# Patient Record
Sex: Female | Born: 1965 | Race: White | Hispanic: No | Marital: Married | Smoking: Former smoker
Health system: Southern US, Community
[De-identification: ages and names within clinical notes are randomized; demographics above are authoritative.]

## PROBLEM LIST (undated history)

## (undated) DIAGNOSIS — F419 Anxiety disorder, unspecified: Secondary | ICD-10-CM

## (undated) DIAGNOSIS — Z9289 Personal history of other medical treatment: Secondary | ICD-10-CM

## (undated) DIAGNOSIS — K219 Gastro-esophageal reflux disease without esophagitis: Secondary | ICD-10-CM

## (undated) DIAGNOSIS — Z1371 Encounter for nonprocreative screening for genetic disease carrier status: Secondary | ICD-10-CM

## (undated) DIAGNOSIS — Z803 Family history of malignant neoplasm of breast: Secondary | ICD-10-CM

## (undated) DIAGNOSIS — K2270 Barrett's esophagus without dysplasia: Secondary | ICD-10-CM

## (undated) DIAGNOSIS — Z8742 Personal history of other diseases of the female genital tract: Secondary | ICD-10-CM

## (undated) HISTORY — DX: Family history of malignant neoplasm of breast: Z80.3

## (undated) HISTORY — DX: Personal history of other diseases of the female genital tract: Z87.42

## (undated) HISTORY — DX: Encounter for nonprocreative screening for genetic disease carrier status: Z13.71

## (undated) HISTORY — DX: Gastro-esophageal reflux disease without esophagitis: K21.9

## (undated) HISTORY — DX: Personal history of other medical treatment: Z92.89

## (undated) HISTORY — DX: Barrett's esophagus without dysplasia: K22.70

## (undated) HISTORY — PX: CRYOTHERAPY: SHX1416

## (undated) HISTORY — DX: Anxiety disorder, unspecified: F41.9

---

## 2004-12-30 ENCOUNTER — Observation Stay: Payer: Self-pay

## 2005-01-24 ENCOUNTER — Observation Stay: Payer: Self-pay | Admitting: Obstetrics and Gynecology

## 2005-02-03 ENCOUNTER — Inpatient Hospital Stay: Payer: Self-pay | Admitting: Obstetrics and Gynecology

## 2006-12-20 ENCOUNTER — Ambulatory Visit: Payer: Self-pay | Admitting: Obstetrics and Gynecology

## 2008-01-02 ENCOUNTER — Ambulatory Visit: Payer: Self-pay | Admitting: Obstetrics and Gynecology

## 2009-03-04 ENCOUNTER — Ambulatory Visit: Payer: Self-pay | Admitting: Obstetrics and Gynecology

## 2010-03-31 ENCOUNTER — Ambulatory Visit: Payer: Self-pay | Admitting: Obstetrics and Gynecology

## 2010-04-06 ENCOUNTER — Ambulatory Visit: Payer: Self-pay | Admitting: Gastroenterology

## 2012-01-12 ENCOUNTER — Ambulatory Visit: Payer: Self-pay | Admitting: Obstetrics and Gynecology

## 2012-09-11 ENCOUNTER — Ambulatory Visit: Payer: Self-pay | Admitting: Gastroenterology

## 2012-09-13 ENCOUNTER — Ambulatory Visit: Payer: Self-pay | Admitting: Gastroenterology

## 2012-09-21 DIAGNOSIS — K2270 Barrett's esophagus without dysplasia: Secondary | ICD-10-CM

## 2012-09-21 HISTORY — DX: Barrett's esophagus without dysplasia: K22.70

## 2012-10-01 ENCOUNTER — Ambulatory Visit: Payer: Self-pay | Admitting: Gastroenterology

## 2012-10-22 ENCOUNTER — Ambulatory Visit: Payer: Self-pay | Admitting: Gastroenterology

## 2014-09-29 ENCOUNTER — Ambulatory Visit: Payer: Self-pay | Admitting: Obstetrics & Gynecology

## 2014-09-29 DIAGNOSIS — Z9289 Personal history of other medical treatment: Secondary | ICD-10-CM

## 2014-09-29 HISTORY — DX: Personal history of other medical treatment: Z92.89

## 2015-05-14 DIAGNOSIS — Z9289 Personal history of other medical treatment: Secondary | ICD-10-CM

## 2015-05-14 HISTORY — DX: Personal history of other medical treatment: Z92.89

## 2015-05-22 DIAGNOSIS — Z1371 Encounter for nonprocreative screening for genetic disease carrier status: Secondary | ICD-10-CM

## 2015-05-22 HISTORY — DX: Encounter for nonprocreative screening for genetic disease carrier status: Z13.71

## 2017-02-27 ENCOUNTER — Telehealth: Payer: Self-pay

## 2017-02-27 NOTE — Telephone Encounter (Signed)
Pt calling - blood in urine.  Thought it was her period but it's not.  [phone]

## 2017-03-01 NOTE — Telephone Encounter (Signed)
Pt would like a call back as no one called her Mon.

## 2017-03-02 NOTE — Telephone Encounter (Signed)
Called JP and adv. Had routed msg to her.  Pt info given and JP will sched.

## 2017-03-03 NOTE — Telephone Encounter (Signed)
Pt coming Monday

## 2017-03-06 ENCOUNTER — Ambulatory Visit: Payer: Self-pay | Admitting: Obstetrics and Gynecology

## 2017-04-10 ENCOUNTER — Encounter: Payer: Self-pay | Admitting: Obstetrics and Gynecology

## 2017-04-10 ENCOUNTER — Ambulatory Visit (INDEPENDENT_AMBULATORY_CARE_PROVIDER_SITE_OTHER): Payer: 59 | Admitting: Obstetrics and Gynecology

## 2017-04-10 VITALS — BP 118/70 | Ht 64.0 in | Wt 186.0 lb

## 2017-04-10 DIAGNOSIS — M545 Low back pain, unspecified: Secondary | ICD-10-CM

## 2017-04-10 DIAGNOSIS — R319 Hematuria, unspecified: Secondary | ICD-10-CM

## 2017-04-10 DIAGNOSIS — F419 Anxiety disorder, unspecified: Secondary | ICD-10-CM | POA: Insufficient documentation

## 2017-04-10 LAB — POCT URINALYSIS DIPSTICK
BILIRUBIN UA: NEGATIVE
Blood, UA: NEGATIVE
GLUCOSE UA: NEGATIVE
KETONES UA: NEGATIVE
LEUKOCYTES UA: NEGATIVE
NITRITE UA: NEGATIVE
Protein, UA: NEGATIVE
Spec Grav, UA: 1.015 (ref 1.010–1.025)
pH, UA: 5 (ref 5.0–8.0)

## 2017-04-10 NOTE — Progress Notes (Signed)
Chief Complaint  Patient presents with  . Urinary Tract Infection    HPI:      Ms. Sara Bentley is a 51 y.o. [id number] who LMP was Patient's last menstrual period was 04/03/2017., presents today for LT LBP sx for the past 2 wks. She had UTI sx about 2 wks when LBP started and went to urgent care. She was treated with cipro but continued to have hematuria and LBP, so she went back a wk later. Dipstick confirmed blood and pt put on different abx (can't remember name). Urine C&S then came back neg so pt stopped abx. Hematuria resolved but pt continues to have LT LBP. Sx start in AM and last all day. She has noted some mild tingling in her LT leg the past day or so. She tried tylenol without relief. She denies any trauma/back injury. No vag sx.   Pt doing well on citalopram 30 mg dose. Increased from 20 mg at annual 11/17.   Patient Active Problem List   Diagnosis Date Noted  . Anxiety    Past Medical History:  Diagnosis Date  . Anxiety   . Barrett's esophagus 09/2012  . 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 Esophageal reflux   . Family history of breast cancer   . GERD (gastroesophageal reflux disease)   . History of abnormal cervical Pap smear    nl since  . History of mammogram 09/29/2014   birads I neg @ [facility]  . History of Papanicolaou smear of cervix 05/14/2015   -/-     Family History  Problem Relation Age of Onset  . Diabetes Brother   . Breast cancer Maternal Grandmother 35  . Diabetes Maternal Uncle     Social History   Social History  . Marital status: Married    Spouse name: N/A  . Number of children: N/A  . Years of education: 35   Occupational History  . homemaker    Social History Main Topics  . Smoking status: Former Research scientist (life sciences)  . Smokeless tobacco: Never Used     Comment: 2004  . Alcohol use Yes  . Drug use: No  . Sexual activity: Yes    Birth control/ protection: Post-menopausal   Other Topics Concern  . Not on file     Social History Narrative  . No narrative on file     Current Outpatient Prescriptions:  .  citalopram (CELEXA) 20 MG tablet, , Disp: , Rfl:  .  omeprazole (PRILOSEC) 20 MG capsule, , Disp: , Rfl:   Review of Systems  Constitutional: Negative for fever.  Gastrointestinal: Negative for blood in stool, constipation, diarrhea, nausea and vomiting.  Genitourinary: Positive for frequency, hematuria and urgency. Negative for dyspareunia, dysuria, flank pain, vaginal bleeding, vaginal discharge and vaginal pain.  Musculoskeletal: Positive for back pain.  Skin: Negative for rash.     OBJECTIVE:   Vitals:  BP 118/70   Ht 5' 4"  (1.626 m)   Wt 186 lb (84.4 kg)   LMP 04/03/2017   BMI 31.93 kg/m   Physical Exam  Abdominal: There is no CVA tenderness.  Musculoskeletal:       Lumbar back: She exhibits no tenderness, no bony tenderness, no swelling and no pain.  Neurological: She is alert. No cranial nerve deficit. Gait normal. Coordination normal.  Vitals reviewed.   Results: Results for orders placed or performed in visit on 04/10/17 (from the past 24 hour(s))  POCT Urinalysis Dipstick  Status: Normal   Collection Time: 04/10/17 12:25 PM  Result Value Ref Range   Color, UA yellow    Clarity, UA clear    Glucose, UA neg    Bilirubin, UA neg    Ketones, UA neg    Spec Grav, UA 1.015 1.010 - 1.025   Blood, UA neg    pH, UA 5.0 5.0 - 8.0   Protein, UA neg    Urobilinogen, UA  0.2 or 1.0 E.U./dL   Nitrite, UA neg    Leukocytes, UA Negative Negative     Assessment/Plan: Acute left-sided low back pain without sciatica - Neg dipstick/exam. Question musculoskeletal given sx and hx. Stretch/ice/heat/NSAIDs. F/u prn.  - Plan: POCT Urinalysis Dipstick  Hematuria, unspecified type - Resolved. F/u prn.   Anxiety - Improved with citalopram 30 mg dose. Doing well.       Return if symptoms worsen or fail to improve.  Alicia B. Copland, PA-C 04/10/2017 12:30 PM

## 2017-06-22 ENCOUNTER — Ambulatory Visit: Payer: 59 | Admitting: Advanced Practice Midwife

## 2017-07-21 ENCOUNTER — Other Ambulatory Visit: Payer: Self-pay | Admitting: Obstetrics and Gynecology

## 2017-07-27 ENCOUNTER — Ambulatory Visit (INDEPENDENT_AMBULATORY_CARE_PROVIDER_SITE_OTHER): Payer: 59 | Admitting: Obstetrics and Gynecology

## 2017-07-27 ENCOUNTER — Encounter: Payer: Self-pay | Admitting: Obstetrics and Gynecology

## 2017-07-27 VITALS — BP 100/60 | HR 64 | Ht 64.5 in | Wt 177.0 lb

## 2017-07-27 DIAGNOSIS — K2100 Gastro-esophageal reflux disease with esophagitis, without bleeding: Secondary | ICD-10-CM

## 2017-07-27 DIAGNOSIS — Z1211 Encounter for screening for malignant neoplasm of colon: Secondary | ICD-10-CM

## 2017-07-27 DIAGNOSIS — Z1231 Encounter for screening mammogram for malignant neoplasm of breast: Secondary | ICD-10-CM

## 2017-07-27 DIAGNOSIS — Z1239 Encounter for other screening for malignant neoplasm of breast: Secondary | ICD-10-CM

## 2017-07-27 DIAGNOSIS — Z01419 Encounter for gynecological examination (general) (routine) without abnormal findings: Secondary | ICD-10-CM

## 2017-07-27 DIAGNOSIS — F419 Anxiety disorder, unspecified: Secondary | ICD-10-CM

## 2017-07-27 MED ORDER — CITALOPRAM HYDROBROMIDE 20 MG PO TABS: ORAL_TABLET | ORAL | 3 refills | Status: DC

## 2017-07-27 MED ORDER — OMEPRAZOLE 20 MG PO CPDR: 20.0000 mg | DELAYED_RELEASE_CAPSULE | Freq: Every day | ORAL | 3 refills | Status: DC

## 2017-07-27 NOTE — Progress Notes (Signed)
PCP: Patient, No Pcp Per   Chief Complaint  Patient presents with  . Gynecologic Exam    HPI:      Ms. Sara Bentley is a 51 y.o. [id number] who LMP was Patient's last menstrual period was 06/30/2017., presents today for her annual examination.  Her menses are regular every 28-30 days, lasting 3 days.  Dysmenorrhea none. She does not have intermenstrual bleeding.  She does not have vasomotor sx.   Sex activity: single partner, contraception - vasectomy. She does not have vaginal dryness.  Last Pap: May 14, 2015  Results were: no abnormalities /neg HPV DNA.  Hx of STDs: HPV  Last mammogram: September 29, 2014  Results were: normal--routine follow-up in 12 months There is a FH of breast cancer in her MGM. Pt is MyRisk neg 2016. IBIS=17%. There is no FH of ovarian cancer. The patient does do self-breast exams.  Colonoscopy: in her 79s with Dr. Gustavo Lah due to GI issues. Neg colonoscopy. Started on omeprazole daily. Pt needs Rx RF.  Tobacco use: The patient denies current or previous tobacco use. Alcohol use: 2 glasses of wine per day(s) Exercise: moderately active  She does not get adequate calcium and Vitamin D in her diet.  Pt takes citalopram 30 mg daily for anxiety with sx control. She needs a RF. No side effects.  Past Medical History:  Diagnosis Date  . Anxiety   . Barrett's esophagus 09/2012  . 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 Esophageal reflux   . Family history of breast cancer   . GERD (gastroesophageal reflux disease)   . History of abnormal cervical Pap smear    nl since  . History of mammogram 09/29/2014   birads I neg @ [facility]  . History of Papanicolaou smear of cervix 05/14/2015   -/-    Past Surgical History:  Procedure Laterality Date  . CESAREAN SECTION  2006  . CRYOTHERAPY     AGE 15s    Family History  Problem Relation Age of Onset  . Diabetes Brother   . Breast cancer Maternal Grandmother 35  . Diabetes  Maternal Uncle     Social History   Social History  . Marital status: Married    Spouse name: N/A  . Number of children: N/A  . Years of education: 89   Occupational History  . homemaker    Social History Main Topics  . Smoking status: Former Research scientist (life sciences)  . Smokeless tobacco: Never Used     Comment: 2004  . Alcohol use Yes  . Drug use: No  . Sexual activity: Yes    Birth control/ protection: Post-menopausal   Other Topics Concern  . Not on file   Social History Narrative  . No narrative on file    No current outpatient prescriptions on file prior to visit.   No current facility-administered medications on file prior to visit.        ROS:  Review of Systems  Constitutional: Negative for fatigue, fever and unexpected weight change.  Respiratory: Negative for cough, shortness of breath and wheezing.   Cardiovascular: Negative for chest pain, palpitations and leg swelling.  Gastrointestinal: Negative for blood in stool, constipation, diarrhea, nausea and vomiting.  Endocrine: Negative for cold intolerance, heat intolerance and polyuria.  Genitourinary: Negative for dyspareunia, dysuria, flank pain, frequency, genital sores, hematuria, menstrual problem, pelvic pain, urgency, vaginal bleeding, vaginal discharge and vaginal pain.  Musculoskeletal: Positive for arthralgias. Negative for back  pain, joint swelling and myalgias.  Skin: Negative for rash.  Neurological: Negative for dizziness, syncope, light-headedness, numbness and headaches.  Hematological: Negative for adenopathy.  Psychiatric/Behavioral: Negative for agitation, confusion, sleep disturbance and suicidal ideas. The patient is not nervous/anxious.      Objective: BP 100/60   Pulse 64   Ht 5' 4.5" (1.638 m)   Wt 177 lb (80.3 kg)   LMP 06/30/2017   BMI 29.91 kg/m    Physical Exam  Constitutional: She is oriented to person, place, and time. She appears well-developed and well-nourished.    Genitourinary: Vagina normal and uterus normal. There is no rash or tenderness on the right labia. There is no rash or tenderness on the left labia. No erythema or tenderness in the vagina. No vaginal discharge found. Right adnexum does not display mass and does not display tenderness. Left adnexum does not display mass and does not display tenderness. Cervix does not exhibit motion tenderness or polyp. Uterus is not enlarged or tender.  Neck: Normal range of motion. No thyromegaly present.  Cardiovascular: Normal rate, regular rhythm and normal heart sounds.   No murmur heard. Pulmonary/Chest: Effort normal and breath sounds normal. Right breast exhibits no mass, no nipple discharge, no skin change and no tenderness. Left breast exhibits no mass, no nipple discharge, no skin change and no tenderness.  Abdominal: Soft. There is no tenderness. There is no guarding.  Musculoskeletal: Normal range of motion.  Neurological: She is alert and oriented to person, place, and time. No cranial nerve deficit.  Psychiatric: She has a normal mood and affect. Her behavior is normal.  Vitals reviewed.   Assessment/Plan:  Encounter for annual routine gynecological examination  Screening for breast cancer - Pt to sched mammo. - Plan: MM DIGITAL SCREENING BILATERAL  Anxiety - Rx RF citalopram 30 mg, [facility] to [facility]. F/u prn.  - Plan: citalopram (CELEXA) 20 MG tablet  Gastroesophageal reflux disease with esophagitis - Rx RF omeprazole.  - Plan: omeprazole (PRILOSEC) 20 MG capsule  Screening for colon cancer - Pt had neg colonoscopy in 2s with Dr. Gustavo Lah.   Meds ordered this encounter  Medications  . omeprazole (PRILOSEC) 20 MG capsule    Sig: Take 1 capsule (20 mg total) by mouth daily.    Dispense:  90 capsule    Refill:  3  . citalopram (CELEXA) 20 MG tablet    Sig: TAKE 1 AND 1/2 TABLETS BY  MOUTH (30MG) ONCE DAILY    Dispense:  135 tablet    Refill:  3            GYN counsel breast self  exam, mammography screening, menopause, adequate intake of calcium and vitamin D, diet and exercise    F/U  Return in about 1 year (around 07/27/2018).  Alicia B. Copland, PA-C 07/27/2017 11:37 AM

## 2017-07-27 NOTE — Patient Instructions (Signed)
[facility]: [phone]  [facility]: [phone] 

## 2017-09-28 ENCOUNTER — Ambulatory Visit
Admission: RE | Admit: 2017-09-28 | Discharge: 2017-09-28 | Disposition: A | Discharge status: Home / Self Care | Payer: 59 | Source: Ambulatory Visit | Attending: Obstetrics and Gynecology | Admitting: Obstetrics and Gynecology

## 2017-09-28 DIAGNOSIS — Z1231 Encounter for screening mammogram for malignant neoplasm of breast: Secondary | ICD-10-CM | POA: Insufficient documentation

## 2017-09-28 DIAGNOSIS — Z1239 Encounter for other screening for malignant neoplasm of breast: Secondary | ICD-10-CM

## 2017-10-02 ENCOUNTER — Other Ambulatory Visit: Payer: Self-pay | Admitting: *Deleted

## 2017-10-02 ENCOUNTER — Inpatient Hospital Stay
Admission: RE | Admit: 2017-10-02 | Discharge: 2017-10-02 | Disposition: A | Discharge status: Home / Self Care | Payer: Self-pay | Source: Ambulatory Visit | Attending: *Deleted | Admitting: *Deleted

## 2017-10-02 DIAGNOSIS — Z9289 Personal history of other medical treatment: Secondary | ICD-10-CM

## 2017-10-03 ENCOUNTER — Encounter: Payer: Self-pay | Admitting: Obstetrics and Gynecology

## 2017-12-17 ENCOUNTER — Emergency Department: Payer: Managed Care, Other (non HMO)

## 2017-12-17 ENCOUNTER — Emergency Department
Admission: EM | Admit: 2017-12-17 | Discharge: 2017-12-17 | Disposition: A | Discharge status: Home / Self Care | Payer: Managed Care, Other (non HMO) | Attending: Emergency Medicine | Admitting: Emergency Medicine

## 2017-12-17 ENCOUNTER — Other Ambulatory Visit: Payer: Self-pay

## 2017-12-17 DIAGNOSIS — W000XXA Fall on same level due to ice and snow, initial encounter: Secondary | ICD-10-CM | POA: Insufficient documentation

## 2017-12-17 DIAGNOSIS — S060X0A Concussion without loss of consciousness, initial encounter: Secondary | ICD-10-CM | POA: Diagnosis not present

## 2017-12-17 DIAGNOSIS — Z87891 Personal history of nicotine dependence: Secondary | ICD-10-CM | POA: Diagnosis not present

## 2017-12-17 DIAGNOSIS — Z79899 Other long term (current) drug therapy: Secondary | ICD-10-CM | POA: Diagnosis not present

## 2017-12-17 DIAGNOSIS — R42 Dizziness and giddiness: Secondary | ICD-10-CM | POA: Diagnosis not present

## 2017-12-17 DIAGNOSIS — Y929 Unspecified place or not applicable: Secondary | ICD-10-CM | POA: Insufficient documentation

## 2017-12-17 DIAGNOSIS — R110 Nausea: Secondary | ICD-10-CM | POA: Diagnosis not present

## 2017-12-17 DIAGNOSIS — Y939 Activity, unspecified: Secondary | ICD-10-CM | POA: Diagnosis not present

## 2017-12-17 DIAGNOSIS — R51 Headache: Secondary | ICD-10-CM | POA: Diagnosis not present

## 2017-12-17 DIAGNOSIS — S0990XA Unspecified injury of head, initial encounter: Secondary | ICD-10-CM | POA: Diagnosis present

## 2017-12-17 DIAGNOSIS — Y999 Unspecified external cause status: Secondary | ICD-10-CM | POA: Insufficient documentation

## 2017-12-17 LAB — BASIC METABOLIC PANEL
ANION GAP: 4 — AB (ref 5–15)
BUN: 11 mg/dL (ref 6–20)
CO2: 27 mmol/L (ref 22–32)
Calcium: 8.6 mg/dL — ABNORMAL LOW (ref 8.9–10.3)
Chloride: 108 mmol/L (ref 101–111)
Creatinine, Ser: 0.52 mg/dL (ref 0.44–1.00)
Glucose, Bld: 99 mg/dL (ref 65–99)
Potassium: 4.2 mmol/L (ref 3.5–5.1)
SODIUM: 139 mmol/L (ref 135–145)

## 2017-12-17 LAB — CBC
HCT: 40.8 % (ref 35.0–47.0)
Hemoglobin: 14 g/dL (ref 12.0–16.0)
MCH: 34.7 pg — ABNORMAL HIGH (ref 26.0–34.0)
MCHC: 34.4 g/dL (ref 32.0–36.0)
MCV: 101.1 fL — ABNORMAL HIGH (ref 80.0–100.0)
Platelets: 152 10*3/uL (ref 150–440)
RBC: 4.03 MIL/uL (ref 3.80–5.20)
RDW: 12.7 % (ref 11.5–14.5)
WBC: 3.8 10*3/uL (ref 3.6–11.0)

## 2017-12-17 LAB — URINALYSIS, COMPLETE (UACMP) WITH MICROSCOPIC
BILIRUBIN URINE: NEGATIVE
Glucose, UA: NEGATIVE mg/dL
HGB URINE DIPSTICK: NEGATIVE
KETONES UR: NEGATIVE mg/dL
LEUKOCYTES UA: NEGATIVE
NITRITE: NEGATIVE
PROTEIN: NEGATIVE mg/dL
Specific Gravity, Urine: 1.017 (ref 1.005–1.030)
pH: 7 (ref 5.0–8.0)

## 2017-12-17 LAB — POCT PREGNANCY, URINE: Preg Test, Ur: NEGATIVE

## 2017-12-17 MED ORDER — ONDANSETRON 4 MG PO TBDP: 4.0000 mg | ORAL_TABLET | Freq: Three times a day (TID) | ORAL | 0 refills | Status: DC | PRN

## 2017-12-17 NOTE — ED Provider Notes (Signed)
[facility] Emergency Department Provider Note   ____________________________________________    I have reviewed the triage vital signs and the nursing notes.   HISTORY  Chief Complaint Fall and Dizziness     HPI Sara Bentley is a 52 y.o. female who presents after a head injury.  Patient reports she slipped last night on ice and fell backwards and struck the back of her head.  She put some ice on her head and did not think anything of it and went to sleep.  This morning she woke up feeling dizzy with some mild nausea.  Denies neuro deficits.  No visual changes.  She does have a mild headache as well.  Not on blood thinners, no history of bleeding issues.  Went to urgent care and they sent her to the ED for CT scan of the head  Past Medical History:  Diagnosis Date  . Anxiety   . Barrett's esophagus 09/2012  . 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 Esophageal reflux   . Family history of breast cancer   . GERD (gastroesophageal reflux disease)   . History of abnormal cervical Pap smear    nl since  . History of mammogram 09/29/2014   birads I neg @ [place]  . History of Papanicolaou smear of cervix 05/14/2015   -/-    Patient Active Problem List   Diagnosis Date Noted  . Anxiety     Past Surgical History:  Procedure Laterality Date  . CESAREAN SECTION  2006  . CRYOTHERAPY     AGE 21s    Prior to Admission medications   Medication Sig Start Date End Date Taking? Authorizing Provider  citalopram (CELEXA) 20 MG tablet TAKE 1 AND 1/2 TABLETS BY  MOUTH (30MG) ONCE DAILY 1/0/62   Copland, Elmo Putt B, PA-C  omeprazole (PRILOSEC) 20 MG capsule Take 1 capsule (20 mg total) by mouth daily. 04/30/47   Copland, Elmo Putt B, PA-C  ondansetron (ZOFRAN ODT) 4 MG disintegrating tablet Take 1 tablet (4 mg total) by mouth every 8 (eight) hours as needed for nausea or vomiting. 12/17/17   Lavonia Drafts, MD     Allergies Patient has  no known allergies.  Family History  Problem Relation Age of Onset  . Diabetes Brother   . Breast cancer Maternal Grandmother 35  . Diabetes Maternal Uncle     Social History Social History   Tobacco Use  . Smoking status: Former Research scientist (life sciences)  . Smokeless tobacco: Never Used  . Tobacco comment: 2004  Substance Use Topics  . Alcohol use: Yes  . Drug use: No    Review of Systems  Constitutional: Mild dizziness Eyes: No visual changes.  ENT: No  neck pain Cardiovascular: Denies chest pain. Respiratory: Denies shortness of breath. Gastrointestinal: No abdominal pain, mild nausea Genitourinary: Negative for dysuria. Musculoskeletal: No back pain Skin: Negative for laceration Neurological: Negative for headaches   ____________________________________________   PHYSICAL EXAM:  VITAL SIGNS: ED Triage Vitals  Enc Vitals Group     BP 12/17/17 1349 (!) 137/93     Pulse Rate 12/17/17 1349 (!) 55     Resp 12/17/17 1349 18     Temp 12/17/17 1349 98.9 F (37.2 C)     Temp Source 12/17/17 1349 Oral     SpO2 12/17/17 1349 100 %     Weight 12/17/17 1349 78.5 kg (173 lb)     Height 12/17/17 1349 1.626 m (5' 4")  Head Circumference --      Peak Flow --      Pain Score 12/17/17 1353 10     Pain Loc --      Pain Edu? --      Excl. in [place]? --     Constitutional: Alert and oriented. No acute distress. Pleasant and interactive Eyes: Conjunctivae are normal.  PERRLA, EOMI Head: Small hematoma to the posterior central scalp Nose: No swelling or epistaxis Mouth/Throat: Mucous membranes are moist.   Neck:  Painless ROM no vertebral tenderness to palpation Cardiovascular: Normal rate, regular rhythm.  Good peripheral circulation. Respiratory: Normal respiratory effort.  No retractions.   Musculoskeletal: Moves all extremities equally.  Warm and well perfused Neurologic:  Normal speech and language. No gross focal neurologic deficits are appreciated.  Skin:  Skin is warm, dry and  intact. No rash noted. Psychiatric: Mood and affect are normal. Speech and behavior are normal.  ____________________________________________   LABS (all labs ordered are listed, but only abnormal results are displayed)  Labs Reviewed  BASIC METABOLIC PANEL - Abnormal; Notable for the following components:      Result Value   Calcium 8.6 (*)    Anion gap 4 (*)    All other components within normal limits  CBC - Abnormal; Notable for the following components:   MCV 101.1 (*)    MCH 34.7 (*)    All other components within normal limits  URINALYSIS, COMPLETE (UACMP) WITH MICROSCOPIC - Abnormal; Notable for the following components:   Color, Urine YELLOW (*)    APPearance HAZY (*)    Bacteria, UA RARE (*)    Squamous Epithelial / LPF 6-30 (*)    All other components within normal limits  POCT PREGNANCY, URINE  CBG MONITORING, ED  POC URINE PREG, ED   ____________________________________________  EKG  ED ECG REPORT I, Lavonia Drafts, the attending physician, personally viewed and interpreted this ECG.  Date: 12/17/2017   Rhythm: Bradycardia QRS Axis: normal Intervals: normal ST/T Wave abnormalities: normal Narrative Interpretation: no evidence of acute ischemia  ____________________________________________  RADIOLOGY  CT head unremarkable ____________________________________________   PROCEDURES  Procedure(s) performed: No  Procedures   Critical Care performed: No ____________________________________________   INITIAL IMPRESSION / ASSESSMENT AND PLAN / ED COURSE  Pertinent labs & imaging results that were available during my care of the patient were reviewed by me and considered in my medical decision making (see chart for details).  Patient well-appearing in no acute distress.  Exam is reassuring.  Suspect her symptoms are related to concussion.  Differential diagnosis also includes ICH, skull fracture although unlikely  CT head is unremarkable.     Will treat patient with Zofran for nausea, recommend Tylenol or Motrin for headache.  Recommend rest, exercise avoidance, head injury avoidance, follow-up with PCP      ____________________________________________   FINAL CLINICAL IMPRESSION(S) / ED DIAGNOSES  Final diagnoses:  Concussion without loss of consciousness, initial encounter        Note:  This document was prepared using Dragon voice recognition software and may include unintentional dictation errors.    Lavonia Drafts, MD 12/17/17 1505

## 2017-12-17 NOTE — ED Triage Notes (Signed)
Pt comes via POV with c/o of falling last night after slipping on some ice and hit her head. Pt denies LOC, but states she has been feeling nausea and dizzy. Pt states she went to [facility] and was sent here for CT of the head. Pt states large knot on back of her head. Pt is A&OX4.

## 2018-08-13 ENCOUNTER — Ambulatory Visit: Payer: 59 | Admitting: Obstetrics and Gynecology

## 2018-08-20 ENCOUNTER — Ambulatory Visit (INDEPENDENT_AMBULATORY_CARE_PROVIDER_SITE_OTHER): Payer: Managed Care, Other (non HMO) | Admitting: Obstetrics and Gynecology

## 2018-08-20 ENCOUNTER — Other Ambulatory Visit (HOSPITAL_COMMUNITY)
Admission: RE | Admit: 2018-08-20 | Discharge: 2018-08-20 | Disposition: A | Discharge status: Home / Self Care | Payer: Managed Care, Other (non HMO) | Source: Ambulatory Visit | Attending: Obstetrics and Gynecology | Admitting: Obstetrics and Gynecology

## 2018-08-20 ENCOUNTER — Encounter: Payer: Self-pay | Admitting: Obstetrics and Gynecology

## 2018-08-20 VITALS — BP 130/90 | HR 64 | Ht 65.0 in | Wt 184.0 lb

## 2018-08-20 DIAGNOSIS — N920 Excessive and frequent menstruation with regular cycle: Secondary | ICD-10-CM

## 2018-08-20 DIAGNOSIS — F418 Other specified anxiety disorders: Secondary | ICD-10-CM

## 2018-08-20 DIAGNOSIS — Z1231 Encounter for screening mammogram for malignant neoplasm of breast: Secondary | ICD-10-CM | POA: Diagnosis not present

## 2018-08-20 DIAGNOSIS — Z124 Encounter for screening for malignant neoplasm of cervix: Secondary | ICD-10-CM | POA: Diagnosis not present

## 2018-08-20 DIAGNOSIS — G4709 Other insomnia: Secondary | ICD-10-CM

## 2018-08-20 DIAGNOSIS — Z01411 Encounter for gynecological examination (general) (routine) with abnormal findings: Secondary | ICD-10-CM | POA: Diagnosis not present

## 2018-08-20 DIAGNOSIS — Z1151 Encounter for screening for human papillomavirus (HPV): Secondary | ICD-10-CM

## 2018-08-20 DIAGNOSIS — F329 Major depressive disorder, single episode, unspecified: Secondary | ICD-10-CM

## 2018-08-20 DIAGNOSIS — Z1239 Encounter for other screening for malignant neoplasm of breast: Secondary | ICD-10-CM

## 2018-08-20 DIAGNOSIS — F419 Anxiety disorder, unspecified: Secondary | ICD-10-CM

## 2018-08-20 DIAGNOSIS — Z01419 Encounter for gynecological examination (general) (routine) without abnormal findings: Secondary | ICD-10-CM

## 2018-08-20 MED ORDER — CITALOPRAM HYDROBROMIDE 40 MG PO TABS: 40.0000 mg | ORAL_TABLET | Freq: Every day | ORAL | 3 refills | Status: DC

## 2018-08-20 NOTE — Patient Instructions (Signed)
I value your feedback and entrusting us with your care. If you get a [facility] patient survey, I would appreciate you taking the time to let us know about your experience today. Thank you!  [facility]: [phone]    

## 2018-08-20 NOTE — Progress Notes (Signed)
PCP: Patient, No Pcp Per   Chief Complaint  Patient presents with  . Gynecologic Exam    HPI:      Ms. Sara Bentley is a 52 y.o. [id number] who LMP was No LMP recorded., presents today for her annual examination.  Her menses are regular every 28-30 days, lasting 3-4 days.  Dysmenorrhea none. She does not have intermenstrual bleeding.  She does not have vasomotor sx.  July and Sept menses heavier, changing pads/tampons Q 1-1/2 hrs for a day and with dime sized clots. Under significant increased stress with loss of her special needs daughter 4/19.   Sex activity: single partner, contraception - vasectomy. She does not have vaginal dryness.  Last Pap: May 14, 2015  Results were: no abnormalities /neg HPV DNA.  Hx of STDs: HPV  Last mammogram: 09/28/17 Results were: normal--routine follow-up in 12 months There is a FH of breast cancer in her MGM. Pt is MyRisk neg 2016. IBIS=17%. There is no FH of ovarian cancer. The patient does do self-breast exams.  Colonoscopy: in her 85s with Dr. Gustavo Lah due to GI issues. Neg colonoscopy. Started on omeprazole daily. Pt needs Rx RF.  Tobacco use: The patient denies current or previous tobacco use. Alcohol use: 2 glasses of wine per day(s) Exercise: very active  She does get adequate calcium and Vitamin D in her diet.  Pt takes citalopram 30 mg daily for anxiety. Did have sx control in past but sx worse since loss of daughter. No side effects. Would like to increase to 40 mg daily. Pt also having trouble sleeping. Tried melatonin with weird dreams. Drinks 2 glasses wine nightly. Has not seen therapist.    Normal labs/lipids 2016.   Past Medical History:  Diagnosis Date  . Anxiety   . Barrett's esophagus 09/2012  . 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 Esophageal reflux   . Family history of breast cancer   . GERD (gastroesophageal reflux disease)   . History of abnormal cervical Pap smear    nl since  . History  of mammogram 09/29/2014   birads I neg @ [place]  . History of Papanicolaou smear of cervix 05/14/2015   -/-    Past Surgical History:  Procedure Laterality Date  . CESAREAN SECTION  2006  . CRYOTHERAPY     AGE 51s    Family History  Problem Relation Age of Onset  . Diabetes Brother   . Breast cancer Maternal Grandmother 35  . Diabetes Maternal Uncle     Social History   Socioeconomic History  . Marital status: Married    Spouse name: Not on file  . Number of children: Not on file  . Years of education: 32  . Highest education level: Not on file  Occupational History  . Occupation: homemaker  Social Needs  . Financial resource strain: Not on file  . Food insecurity:    Worry: Not on file    Inability: Not on file  . Transportation needs:    Medical: Not on file    Non-medical: Not on file  Tobacco Use  . Smoking status: Former Research scientist (life sciences)  . Smokeless tobacco: Never Used  . Tobacco comment: 2004  Substance and Sexual Activity  . Alcohol use: Yes  . Drug use: No  . Sexual activity: Yes    Birth control/protection: Post-menopausal  Lifestyle  . Physical activity:    Days per week: Not on file    Minutes  per session: Not on file  . Stress: Not on file  Relationships  . Social connections:    Talks on phone: Not on file    Gets together: Not on file    Attends religious service: Not on file    Active member of club or organization: Not on file    Attends meetings of clubs or organizations: Not on file    Relationship status: Not on file  . Intimate partner violence:    Fear of current or ex partner: Not on file    Emotionally abused: Not on file    Physically abused: Not on file    Forced sexual activity: Not on file  Other Topics Concern  . Not on file  Social History Narrative  . Not on file    Current Outpatient Medications on File Prior to Visit  Medication Sig Dispense Refill  . omeprazole (PRILOSEC) 20 MG capsule Take 1 capsule (20 mg total) by mouth  daily. 90 capsule 3   No current facility-administered medications on file prior to visit.      ROS:  Review of Systems  Constitutional: Negative for fatigue, fever and unexpected weight change.  Respiratory: Negative for cough, shortness of breath and wheezing.   Cardiovascular: Negative for chest pain, palpitations and leg swelling.  Gastrointestinal: Negative for blood in stool, constipation, diarrhea, nausea and vomiting.  Endocrine: Negative for cold intolerance, heat intolerance and polyuria.  Genitourinary: Negative for dyspareunia, dysuria, flank pain, frequency, genital sores, hematuria, menstrual problem, pelvic pain, urgency, vaginal bleeding, vaginal discharge and vaginal pain.  Musculoskeletal: Positive for arthralgias. Negative for back pain, joint swelling and myalgias.  Skin: Negative for rash.  Neurological: Negative for dizziness, syncope, light-headedness, numbness and headaches.  Hematological: Negative for adenopathy.  Psychiatric/Behavioral: Positive for agitation and dysphoric mood. Negative for confusion, sleep disturbance and suicidal ideas. The patient is not nervous/anxious.    Objective: BP 130/90   Pulse 64   Ht 5' 5"  (1.651 m)   Wt 184 lb (83.5 kg)   BMI 30.62 kg/m    Physical Exam  Constitutional: She is oriented to person, place, and time. She appears well-developed and well-nourished.  Genitourinary: Vagina normal and uterus normal. There is no rash or tenderness on the right labia. There is no rash or tenderness on the left labia. No erythema or tenderness in the vagina. No vaginal discharge found. Right adnexum does not display mass and does not display tenderness. Left adnexum does not display mass and does not display tenderness. Cervix does not exhibit motion tenderness or polyp. Uterus is not enlarged or tender.  Neck: Normal range of motion. No thyromegaly present.  Cardiovascular: Normal rate, regular rhythm and normal heart sounds.  No  murmur heard. Pulmonary/Chest: Effort normal and breath sounds normal. Right breast exhibits no mass, no nipple discharge, no skin change and no tenderness. Left breast exhibits no mass, no nipple discharge, no skin change and no tenderness.  Abdominal: Soft. There is no tenderness. There is no guarding.  Musculoskeletal: Normal range of motion.  Neurological: She is alert and oriented to person, place, and time. No cranial nerve deficit.  Psychiatric: She has a normal mood and affect. Her behavior is normal.  Vitals reviewed.   Assessment/Plan:  Encounter for annual routine gynecological examination  Cervical cancer screening - Plan: Cytology - PAP  Screening for HPV (human papillomavirus) - Plan: Cytology - PAP  Screening for breast cancer - Pt to sched mammo. - Plan: MM 3D SCREEN BREAST  BILATERAL  Anxiety and depression - Sx increase after loss of daughter. Increase citalopram to 40 mg daily. Suggested therapist (names given). Decrease alcohol use/cont exercise - Plan: citalopram (CELEXA) 40 MG tablet  Other insomnia - Discussed Rx meds but pt drinking about 2 glasses of wine nightly. Recommended cessation to see if sleep improves. If not, can add med. F/u prn.  Menorrhagia with regular cycle - For 2 cycles, no DUB. Most likely stress related. F/u if sx persist for further eval with labs/u/s.    Meds ordered this encounter  Medications  . citalopram (CELEXA) 40 MG tablet    Sig: Take 1 tablet (40 mg total) by mouth daily.    Dispense:  90 tablet    Refill:  3    Order Specific Question:   Supervising Provider    Answer:   Gae Dry [[id number]]            GYN counsel breast self exam, mammography screening, menopause, adequate intake of calcium and vitamin D, diet and exercise    F/U  Return in about 1 year (around 08/21/2019).  Razi Hickle B. Lenell Lama, PA-C 08/20/2018 11:37 AM

## 2018-08-22 LAB — CYTOLOGY - PAP
Diagnosis: NEGATIVE
HPV (WINDOPATH): NOT DETECTED

## 2018-09-08 ENCOUNTER — Other Ambulatory Visit: Payer: Self-pay | Admitting: Obstetrics and Gynecology

## 2018-09-08 DIAGNOSIS — K2100 Gastro-esophageal reflux disease with esophagitis, without bleeding: Secondary | ICD-10-CM

## 2018-09-10 NOTE — Telephone Encounter (Signed)
Please advise 

## 2018-09-11 ENCOUNTER — Other Ambulatory Visit: Payer: Self-pay | Admitting: Obstetrics and Gynecology

## 2018-09-11 DIAGNOSIS — F329 Major depressive disorder, single episode, unspecified: Secondary | ICD-10-CM

## 2018-09-11 DIAGNOSIS — K2100 Gastro-esophageal reflux disease with esophagitis, without bleeding: Secondary | ICD-10-CM

## 2018-09-11 DIAGNOSIS — F419 Anxiety disorder, unspecified: Principal | ICD-10-CM

## 2018-09-11 MED ORDER — OMEPRAZOLE 20 MG PO CPDR: 20.0000 mg | DELAYED_RELEASE_CAPSULE | Freq: Every day | ORAL | 3 refills | Status: DC

## 2018-09-11 MED ORDER — CITALOPRAM HYDROBROMIDE 40 MG PO TABS: 40.0000 mg | ORAL_TABLET | Freq: Every day | ORAL | 3 refills | Status: DC

## 2018-09-11 NOTE — Progress Notes (Unsigned)
[facility] RF [facility].

## 2018-09-11 NOTE — Telephone Encounter (Signed)
Pt calling regarding her rx refill that was denied by [facility]. Pt was just in here a few weeks ago & thought it was all taken care of. Her insurance company advised her to contact us to get the paper work filled out correctly. [phone]

## 2018-09-11 NOTE — Telephone Encounter (Signed)
Pls let pt know Rxs sent to [facility] now. Had sent them to [facility] at annual which is why I needed her to call and clarify pharmacy. Thx

## 2018-09-11 NOTE — Telephone Encounter (Signed)
Pt aware.

## 2018-09-13 ENCOUNTER — Other Ambulatory Visit: Payer: Self-pay | Admitting: Obstetrics and Gynecology

## 2018-09-13 DIAGNOSIS — K2100 Gastro-esophageal reflux disease with esophagitis, without bleeding: Secondary | ICD-10-CM

## 2018-09-13 MED ORDER — PANTOPRAZOLE SODIUM 20 MG PO TBEC: 20.0000 mg | DELAYED_RELEASE_TABLET | Freq: Every day | ORAL | 3 refills | Status: DC

## 2018-09-13 NOTE — Progress Notes (Signed)
Rx change from omeprazole to pantoprazole due to increased citalopram dose (40 mg now) and possible QT prolongation with omeprazole. LM for pt with Rx change. [facility]xd to [facility]. F/u prn.

## 2018-09-24 ENCOUNTER — Other Ambulatory Visit: Payer: Self-pay | Admitting: Obstetrics and Gynecology

## 2019-01-28 ENCOUNTER — Telehealth: Payer: Self-pay

## 2019-01-28 DIAGNOSIS — N924 Excessive bleeding in the premenopausal period: Secondary | ICD-10-CM

## 2019-01-28 NOTE — Telephone Encounter (Signed)
Pt calling c/o bright red spotting and cramping the past 2 weeks. Does pt need to come in to be seen by you?

## 2019-01-29 NOTE — Telephone Encounter (Signed)
Can follow this cycle. If happens again next cycle/future cycles, pt needs appt and u/s. Most likely hormonal changes.

## 2019-01-29 NOTE — Telephone Encounter (Signed)
Pt aware.

## 2019-04-10 NOTE — Telephone Encounter (Signed)
Pt called triage and stated her cycles are heavier, she's also losing hair. Do you want pt to go ahead and schedule appt?

## 2019-04-10 NOTE — Telephone Encounter (Signed)
Can you please schedule pt?  

## 2019-04-10 NOTE — Telephone Encounter (Signed)
Let's check labs and GYN u/s. Orders placed. Will call pt with results (since I won't be back in office till 04/22/19).

## 2019-04-17 ENCOUNTER — Ambulatory Visit (INDEPENDENT_AMBULATORY_CARE_PROVIDER_SITE_OTHER): Payer: Managed Care, Other (non HMO)

## 2019-04-17 ENCOUNTER — Other Ambulatory Visit: Payer: Managed Care, Other (non HMO)

## 2019-04-17 ENCOUNTER — Other Ambulatory Visit: Payer: Self-pay

## 2019-04-17 DIAGNOSIS — N924 Excessive bleeding in the premenopausal period: Secondary | ICD-10-CM

## 2019-04-17 DIAGNOSIS — D252 Subserosal leiomyoma of uterus: Secondary | ICD-10-CM

## 2019-04-17 DIAGNOSIS — D251 Intramural leiomyoma of uterus: Secondary | ICD-10-CM | POA: Diagnosis not present

## 2019-04-18 LAB — TSH+FREE T4
Free T4: 1 ng/dL (ref 0.82–1.77)
TSH: 1.63 u[IU]/mL (ref 0.450–4.500)

## 2019-04-19 ENCOUNTER — Telehealth: Payer: Self-pay | Admitting: Obstetrics and Gynecology

## 2019-04-19 DIAGNOSIS — N83202 Unspecified ovarian cyst, left side: Secondary | ICD-10-CM

## 2019-04-19 NOTE — Telephone Encounter (Signed)
Pt aware of normal thyroid and GYN u/s results. Pt with 3 small leio. Having perimenopausal cycles in terms of skipping months and then having heavier flow when she does have her period. No BTB/DUB. Reassurance. Pt also with LTO complex cyst. Rechk u/s in 8 wks. Pt to call to sched in 7/20. Has noted slight LLQ pain.  Pt also with hair loss. Could be stress or low iron. Add MVI with iron. F/u prn.

## 2019-06-22 ENCOUNTER — Other Ambulatory Visit: Payer: Self-pay | Admitting: Obstetrics and Gynecology

## 2019-06-22 DIAGNOSIS — K2100 Gastro-esophageal reflux disease with esophagitis, without bleeding: Secondary | ICD-10-CM

## 2019-07-05 ENCOUNTER — Other Ambulatory Visit: Payer: Self-pay | Admitting: Obstetrics and Gynecology

## 2019-07-05 DIAGNOSIS — F329 Major depressive disorder, single episode, unspecified: Secondary | ICD-10-CM

## 2019-07-05 DIAGNOSIS — F419 Anxiety disorder, unspecified: Secondary | ICD-10-CM

## 2019-07-17 ENCOUNTER — Telehealth: Payer: Self-pay | Admitting: Obstetrics and Gynecology

## 2019-07-17 DIAGNOSIS — N83202 Unspecified ovarian cyst, left side: Secondary | ICD-10-CM

## 2019-07-17 NOTE — Telephone Encounter (Signed)
Order in system. Pls sched. Thx.

## 2019-07-17 NOTE — Telephone Encounter (Signed)
Can sched now. Thx

## 2019-07-17 NOTE — Telephone Encounter (Signed)
Patient is schedule for annual and wants to have ultrasound done before office appointment. Could you place new order for ultrasound order place in May is no longer valid.

## 2019-08-08 ENCOUNTER — Other Ambulatory Visit: Payer: Self-pay | Admitting: Obstetrics and Gynecology

## 2019-08-08 DIAGNOSIS — K2100 Gastro-esophageal reflux disease with esophagitis, without bleeding: Secondary | ICD-10-CM

## 2019-08-12 ENCOUNTER — Encounter: Payer: Self-pay | Admitting: Obstetrics and Gynecology

## 2019-08-21 NOTE — Patient Instructions (Addendum)
I value your feedback and entrusting us with your care. If you get a [place] patient survey, I would appreciate you taking the time to let us know about your experience today. Thank you!  [facility]: [phone]    

## 2019-08-21 NOTE — Progress Notes (Signed)
PCP: Patient, No Pcp Per   Chief Complaint  Patient presents with  . Gynecologic Exam    had u/s this morning    HPI:      Ms. Sara Bentley is a 53 y.o. [id number] who LMP was Patient's last menstrual period was 06/30/2017., presents today for her annual examination.  Her menses are irregular now due to menopause, lasting 4 days.  Dysmenorrhea mild improved with tylenol. She does not have intermenstrual bleeding. Occas mild vasomotor sx.  Has missed a couple months but flow is heavier (still moderate) when she does get her menses.   Had GYN u/s for menorrhagia 5/20 with EM=4.69m, 3 small leio and LTO complex cyst. Repeat u/s today for LTO cyst. Thyroid normal 5/20.   Sex activity: single partner, contraception - vasectomy. She does not have vaginal dryness/ no postcoital bleeding.  Last Pap: 08/20/18  Results were: no abnormalities /neg HPV DNA.  Hx of STDs: HPV  Last mammogram: 09/28/17 Results were: normal--routine follow-up in 12 months There is a FH of breast cancer in her MGM. Pt is MyRisk neg 2016. IBIS=17%. There is no FH of ovarian cancer. The patient does do self-breast exams.  Colonoscopy: in her [address] Dr[address] SGustavo Lahdue to GI issues. Neg colonoscopy. Needs to f/u with him for when next one due. Started on omeprazole daily for GERD/Barrett's esophagus. Now on pantoprazole. Pt needs Rx RF and will f/u with GI re: mgmt.  Tobacco use: The patient denies current or previous tobacco use. Alcohol use: 2 glasses of wine per day(s)  No drug use.  Exercise: very active  She does get adequate calcium but not Vitamin D in her diet.  Pt takes citalopram 40 mg daily for anxiety with sx control. Did have sx control in past but sx worse since loss of daughter 2019. No side effects.    Labs with PCP.  Past Medical History:  Diagnosis Date  . Anxiety   . Barrett's esophagus 09/2012  . 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 Esophageal reflux   . Family history of breast cancer   . GERD (gastroesophageal reflux disease)   . History of abnormal cervical Pap smear    nl since  . History of mammogram 09/29/2014   [place] . History of Papanicolaou smear of cervix 05/14/2015   -/-    Past Surgical History:  Procedure Laterality Date  . CESAREAN SECTION  2006  . CRYOTHERAPY     AGE [age]s   Family History  Problem Relation Age of Onset  . Diabetes Brother   . Breast cancer Maternal Grandmother 35  . Diabetes Maternal Uncle     Social History   Socioeconomic History  . Marital status: Married    Spouse name: Not on file  . Number of children: Not on file  . Years of education: 146 . Highest education level: Not on file  Occupational History  . Occupation: homemaker  Social Needs  . Financial resource strain: Not on file  . Food insecurity    Worry: Not on file    Inability: Not on file  . Transportation needs    Medical: Not on file    Non-medical: Not on file  Tobacco Use  . Smoking status: Former SResearch scientist (life sciences) . Smokeless tobacco: Never Used  . Tobacco comment: 2004  Substance and Sexual Activity  . Alcohol  use: Yes  . Drug use: No  . Sexual activity: Yes    Birth control/protection: Post-menopausal  Lifestyle  . Physical activity    Days per week: Not on file    Minutes per session: Not on file  . Stress: Not on file  Relationships  . Social Herbalist on phone: Not on file    Gets together: Not on file    Attends religious service: Not on file    Active member of club or organization: Not on file    Attends meetings of clubs or organizations: Not on file    Relationship status: Not on file  . Intimate partner violence    Fear of current or ex partner: Not on file    Emotionally abused: Not on file    Physically abused: Not on file    Forced sexual activity: Not on file  Other Topics Concern  . Not on file  Social History Narrative  .  Not on file    No current outpatient medications on file prior to visit.   No current facility-administered medications on file prior to visit.      ROS:  Review of Systems  Constitutional: Negative for fatigue, fever and unexpected weight change.  Respiratory: Negative for cough, shortness of breath and wheezing.   Cardiovascular: Negative for chest pain, palpitations and leg swelling.  Gastrointestinal: Negative for blood in stool, constipation, diarrhea, nausea and vomiting.  Endocrine: Negative for cold intolerance, heat intolerance and polyuria.  Genitourinary: Negative for dyspareunia, dysuria, flank pain, frequency, genital sores, hematuria, menstrual problem, pelvic pain, urgency, vaginal bleeding, vaginal discharge and vaginal pain.  Musculoskeletal: Negative for arthralgias, back pain, joint swelling and myalgias.  Skin: Negative for rash.  Neurological: Negative for dizziness, syncope, light-headedness, numbness and headaches.  Hematological: Negative for adenopathy.  Psychiatric/Behavioral: Positive for agitation and dysphoric mood. Negative for confusion, sleep disturbance and suicidal ideas. The patient is not nervous/anxious.    Objective: BP 120/90   Ht 5' 5"  (1.651 m)   Wt 182 lb (82.6 kg)   LMP 06/30/2017   BMI 30.29 kg/m    Physical Exam Constitutional:      Appearance: She is well-developed.  Genitourinary:     Vulva, vagina, uterus, right adnexa and left adnexa normal.     No vulval lesion or tenderness noted.     No vaginal discharge, erythema or tenderness.     Cervical polyp present.     No cervical motion tenderness.     Uterus is not enlarged or tender.     No right or left adnexal mass present.     Right adnexa not tender.     Left adnexa not tender.     Genitourinary Comments: ENDOCX POLYP AT CX OS  Neck:     Musculoskeletal: Normal range of motion.     Thyroid: No thyromegaly.  Cardiovascular:     Rate and Rhythm: Normal rate and regular  rhythm.     Heart sounds: Normal heart sounds. No murmur.  Pulmonary:     Effort: Pulmonary effort is normal.     Breath sounds: Normal breath sounds.  Chest:     Breasts:        Right: No mass, nipple discharge, skin change or tenderness.        Left: No mass, nipple discharge, skin change or tenderness.  Abdominal:     Palpations: Abdomen is soft.     Tenderness: There is no abdominal tenderness.  There is no guarding.  Musculoskeletal: Normal range of motion.  Neurological:     General: No focal deficit present.     Mental Status: She is alert and oriented to person, place, and time.     Cranial Nerves: No cranial nerve deficit.  Skin:    General: Skin is warm and dry.  Psychiatric:        Mood and Affect: Mood normal.        Behavior: Behavior normal.        Thought Content: Thought content normal.        Judgment: Judgment normal.  Vitals signs reviewed.    ENDOCERVICAL POLYP REMOVAL Cervix visualized and polyp noted.  Ring forcep applied to cervix and twisting motion removed polyp intact.  Hemostasis obtained.  RESULTS:   ULTRASOUND REPORT  Location: [facility]  Date of Service: 08/22/2019     Indications: F/u ovarian cyst Findings:  The uterus is anteverted and measures 9.5 x 5.6 x 5.5 cm. Echo texture is heterogenous with evidence of focal masses. Within the uterus are multiple suspected fibroids measuring: Fibroid 1: 25 x 13 x 20 mm intramural posterior Fibroid 2: 21 x 14 x 21 mm posterior subserosal   The Endometrium measures 8.8 mm.  Possible endometrial polyp measuring 14 x 6 x 11 mm There appears to be a cervical polyp that is partially past the external cervical os. This polyp measures 22 x 10 x 16 mm.   Right Ovary measures 3.6 x 1.6 x 2.0 cm. It is not normal in appearance. There is a complex cyst in the right ovary with a fluid/fluid level. This cyst measures 22 x 15 x 22 mm. There is not blood flow seen within this cyst.  Left Ovary  measures 2.9 x 1.6 x 1.1 cm cm. It is not normal in appearance. There is a calcification in the left ovary measuring 14 x 8 x 11 mm.  Survey of the adnexa demonstrates no adnexal masses. There is no free fluid in the cul de sac.  Impression: 1. There is a cervical polyp that extends externally.  2. There is a possible endometrial polyp.  3. There are two uterine fibroids seen.  4. There is a complex cyst with a fluid/fluid level in the right ovary, no blood flow. 5. There is a calcification in the left ovary.   Recommendations: 1.Clinical correlation with the patient's History and Physical Exam.   Gweneth Dimitri, RT   Assessment/Plan:  Encounter for annual routine gynecological examination  Encounter for screening mammogram for malignant neoplasm of breast - Plan: MM 3D SCREEN BREAST BILATERAL; pt to sched mammo  Perimenopause--No DUB. F/u prn.   Cysts of both ovaries - Plan: US PELVIS TRANSVAGINAL NON-OB (TV ONLY)--LTO cyst from 5/20 resolved, now has RTO cyst. Rechk  U/s in 3 months.   Endocervical polyp - Plan: Surgical pathology; removed in office.   Anxiety and depression - Sx increase after loss of daughter. Rx RF citalopram to 40 mg daily.  - Plan: citalopram (CELEXA) 40 MG tablet  Gastroesophageal reflux disease with esophagitis - Plan: pantoprazole (PROTONIX) 20 MG tablet; Rx RF. Pt to f/u with GI re: re-eval/mgmt.   Screening for colon cancer--pt to f/u with [facility] GI re: when next colonoscopy due  Meds ordered this encounter  Medications  . citalopram (CELEXA) 40 MG tablet    Sig: Take 1 tablet (40 mg total) by mouth daily.    Dispense:  90 tablet    Refill:  3    Order Specific Question:   Supervising Provider    Answer:   Gae Dry [[id number]]  . pantoprazole (PROTONIX) 20 MG tablet    Sig: Take 1 tablet (20 mg total) by mouth daily.    Dispense:  90 tablet    Refill:  3    Order Specific Question:   Supervising Provider    Answer:   Gae Dry [[id number]]            GYN counsel breast self exam, mammography screening, menopause, adequate intake of calcium and vitamin D, diet and exercise    F/U  Return in about 3 months (around 11/22/2019) for GYN u/s for RTO cyst f/u--[facility] to call pt.  Alicia B. Copland, PA-C 08/22/2019 10:56 AM

## 2019-08-22 ENCOUNTER — Other Ambulatory Visit (HOSPITAL_COMMUNITY)
Admission: RE | Admit: 2019-08-22 | Discharge: 2019-08-22 | Disposition: A | Discharge status: Home / Self Care | Payer: Managed Care, Other (non HMO) | Source: Ambulatory Visit | Attending: Obstetrics and Gynecology | Admitting: Obstetrics and Gynecology

## 2019-08-22 ENCOUNTER — Other Ambulatory Visit: Payer: Self-pay

## 2019-08-22 ENCOUNTER — Ambulatory Visit (INDEPENDENT_AMBULATORY_CARE_PROVIDER_SITE_OTHER): Payer: Managed Care, Other (non HMO)

## 2019-08-22 ENCOUNTER — Encounter: Payer: Self-pay | Admitting: Obstetrics and Gynecology

## 2019-08-22 ENCOUNTER — Ambulatory Visit (INDEPENDENT_AMBULATORY_CARE_PROVIDER_SITE_OTHER): Payer: Managed Care, Other (non HMO) | Admitting: Obstetrics and Gynecology

## 2019-08-22 VITALS — BP 120/90 | Ht 65.0 in | Wt 182.0 lb

## 2019-08-22 DIAGNOSIS — D251 Intramural leiomyoma of uterus: Secondary | ICD-10-CM

## 2019-08-22 DIAGNOSIS — N951 Menopausal and female climacteric states: Secondary | ICD-10-CM

## 2019-08-22 DIAGNOSIS — Z01419 Encounter for gynecological examination (general) (routine) without abnormal findings: Secondary | ICD-10-CM

## 2019-08-22 DIAGNOSIS — N841 Polyp of cervix uteri: Secondary | ICD-10-CM | POA: Diagnosis present

## 2019-08-22 DIAGNOSIS — Z1231 Encounter for screening mammogram for malignant neoplasm of breast: Secondary | ICD-10-CM

## 2019-08-22 DIAGNOSIS — D252 Subserosal leiomyoma of uterus: Secondary | ICD-10-CM | POA: Diagnosis not present

## 2019-08-22 DIAGNOSIS — F32A Depression, unspecified: Secondary | ICD-10-CM

## 2019-08-22 DIAGNOSIS — F419 Anxiety disorder, unspecified: Secondary | ICD-10-CM | POA: Insufficient documentation

## 2019-08-22 DIAGNOSIS — N83201 Unspecified ovarian cyst, right side: Secondary | ICD-10-CM

## 2019-08-22 DIAGNOSIS — K219 Gastro-esophageal reflux disease without esophagitis: Secondary | ICD-10-CM | POA: Insufficient documentation

## 2019-08-22 DIAGNOSIS — N83202 Unspecified ovarian cyst, left side: Secondary | ICD-10-CM | POA: Insufficient documentation

## 2019-08-22 DIAGNOSIS — K2100 Gastro-esophageal reflux disease with esophagitis, without bleeding: Secondary | ICD-10-CM

## 2019-08-22 DIAGNOSIS — F329 Major depressive disorder, single episode, unspecified: Secondary | ICD-10-CM | POA: Insufficient documentation

## 2019-08-22 MED ORDER — PANTOPRAZOLE SODIUM 20 MG PO TBEC: 20.0000 mg | DELAYED_RELEASE_TABLET | Freq: Every day | ORAL | 3 refills | Status: DC

## 2019-08-22 MED ORDER — CITALOPRAM HYDROBROMIDE 40 MG PO TABS: 40.0000 mg | ORAL_TABLET | Freq: Every day | ORAL | 3 refills | Status: DC

## 2019-08-24 ENCOUNTER — Other Ambulatory Visit: Payer: Self-pay | Admitting: Obstetrics and Gynecology

## 2019-08-24 DIAGNOSIS — K2100 Gastro-esophageal reflux disease with esophagitis, without bleeding: Secondary | ICD-10-CM

## 2019-08-25 NOTE — Telephone Encounter (Signed)
Rx eRxd to [facility] last wk. Pls confirm which pharmacy ([facility]). Thx.

## 2019-08-26 ENCOUNTER — Encounter: Payer: Self-pay | Admitting: Obstetrics and Gynecology

## 2019-08-26 LAB — SURGICAL PATHOLOGY

## 2019-08-26 NOTE — Telephone Encounter (Signed)
Called pt, someone answered the phone but would not talk.

## 2019-09-25 ENCOUNTER — Other Ambulatory Visit: Payer: Self-pay | Admitting: Obstetrics and Gynecology

## 2019-09-25 DIAGNOSIS — F419 Anxiety disorder, unspecified: Secondary | ICD-10-CM

## 2019-09-25 DIAGNOSIS — F329 Major depressive disorder, single episode, unspecified: Secondary | ICD-10-CM

## 2019-10-11 ENCOUNTER — Other Ambulatory Visit: Payer: Self-pay | Admitting: Obstetrics and Gynecology

## 2019-10-11 DIAGNOSIS — F329 Major depressive disorder, single episode, unspecified: Secondary | ICD-10-CM

## 2019-10-11 DIAGNOSIS — F32A Depression, unspecified: Secondary | ICD-10-CM

## 2019-11-25 ENCOUNTER — Other Ambulatory Visit: Payer: Managed Care, Other (non HMO)

## 2020-01-13 ENCOUNTER — Ambulatory Visit: Payer: Managed Care, Other (non HMO) | Attending: Internal Medicine

## 2020-01-13 DIAGNOSIS — Z23 Encounter for immunization: Secondary | ICD-10-CM

## 2020-01-13 NOTE — Progress Notes (Signed)
   Covid-19 Vaccination Clinic  Name:  Sara Bentley    MRN: [id number] DOB: 05-07-1966  01/13/2020  [name] was observed post Covid-19 immunization for 15 minutes without incidence. She was provided with Vaccine Information Sheet and instruction to access the V-Safe system.   [name] was instructed to call 911 with any severe reactions post vaccine: Marland Kitchen Difficulty breathing  . Swelling of your face and throat  . A fast heartbeat  . A bad rash all over your body  . Dizziness and weakness    Immunizations Administered    Name Date Dose VIS Date Route   Moderna COVID-19 Vaccine 01/13/2020  2:54 PM 0.5 mL 10/22/2019 Intramuscular   Manufacturer: Moderna   Lot: [id number]   [id number]

## 2020-01-15 ENCOUNTER — Ambulatory Visit
Admission: RE | Admit: 2020-01-15 | Discharge: 2020-01-15 | Disposition: A | Discharge status: Home / Self Care | Payer: Managed Care, Other (non HMO) | Source: Ambulatory Visit | Attending: Obstetrics and Gynecology | Admitting: Obstetrics and Gynecology

## 2020-01-15 ENCOUNTER — Encounter: Payer: Self-pay | Admitting: Obstetrics and Gynecology

## 2020-01-15 DIAGNOSIS — Z1231 Encounter for screening mammogram for malignant neoplasm of breast: Secondary | ICD-10-CM | POA: Diagnosis not present

## 2020-02-11 ENCOUNTER — Ambulatory Visit: Payer: Managed Care, Other (non HMO) | Attending: Internal Medicine

## 2020-02-11 DIAGNOSIS — Z23 Encounter for immunization: Secondary | ICD-10-CM

## 2020-02-11 NOTE — Progress Notes (Signed)
   Covid-19 Vaccination Clinic  Name:  Sara Bentley    MRN: [id number] DOB: 1966-05-26  02/11/2020  [name] was observed post Covid-19 immunization for 15 minutes without incident. She was provided with Vaccine Information Sheet and instruction to access the V-Safe system.   [name] was instructed to call 911 with any severe reactions post vaccine: Marland Kitchen Difficulty breathing  . Swelling of face and throat  . A fast heartbeat  . A bad rash all over body  . Dizziness and weakness   Immunizations Administered    Name Date Dose VIS Date Route   Moderna COVID-19 Vaccine 02/11/2020  1:57 PM 0.5 mL 10/22/2019 Intramuscular   Manufacturer: Levan Hurst   LotUT:[id number]   [place]:[id number]

## 2020-07-01 ENCOUNTER — Other Ambulatory Visit: Payer: Self-pay | Admitting: Obstetrics and Gynecology

## 2020-07-01 DIAGNOSIS — K2100 Gastro-esophageal reflux disease with esophagitis, without bleeding: Secondary | ICD-10-CM

## 2020-07-17 ENCOUNTER — Other Ambulatory Visit: Payer: Self-pay | Admitting: Obstetrics and Gynecology

## 2020-07-17 DIAGNOSIS — K2100 Gastro-esophageal reflux disease with esophagitis, without bleeding: Secondary | ICD-10-CM

## 2020-08-05 ENCOUNTER — Other Ambulatory Visit: Payer: Self-pay | Admitting: Obstetrics and Gynecology

## 2020-08-05 DIAGNOSIS — F32A Depression, unspecified: Secondary | ICD-10-CM

## 2020-08-21 ENCOUNTER — Other Ambulatory Visit: Payer: Self-pay | Admitting: Obstetrics and Gynecology

## 2020-08-21 DIAGNOSIS — F419 Anxiety disorder, unspecified: Secondary | ICD-10-CM

## 2020-12-22 ENCOUNTER — Telehealth: Payer: Self-pay

## 2020-12-22 NOTE — Telephone Encounter (Signed)
Pt calling; has been spotting since 1/4th; last couple of weeks has been bleeding pretty heavily.  Be seen or what to do?  Normal?  [phone]  Pt states the heavier days she is changing a pad about every 2hrs; today it's just spotty; flow goes back and forth. Adv will let [facility] know.

## 2020-12-23 NOTE — Telephone Encounter (Signed)
Pt past due for annual. Pls sched sooner than 01/20/21 (can add onto sched) and we'll address bleeding then. Thx

## 2020-12-23 NOTE — Telephone Encounter (Signed)
Patient is scheduled for 01/20/21.

## 2020-12-30 NOTE — Patient Instructions (Addendum)
I value your feedback and you entrusting us with your care. If you get a [place] patient survey, I would appreciate you taking the time to let us know about your experience today. Thank you!  [facility]: [phone]      

## 2020-12-30 NOTE — Progress Notes (Signed)
PCP: Patient, No Pcp Per   Chief Complaint  Patient presents with  . Gynecologic Exam    Spotting/bleeding since jan 4, mild cramping    HPI:      Ms. Sara Bentley is a 55 y.o. [id number] who LMP was Patient's last menstrual period was 11/24/2020 (exact date)., presents today for her annual examination.  Her menses are usually infrequent due to menopause, spotting for a few days.  Dysmenorrhea mild improved with tylenol. She does not have intermenstrual bleeding. This menses with normal spotting for a few days, but then flow continued, some days heavier changing products every few hrs, alternating with spotting. Has been going on for a month. Mild dysmen. No vasomotor sx.    Had GYN u/s for menorrhagia/ovar cyst f/u 10/20 with EM=8.57m, 2 small leio and RTO complex cyst. Also with possible endometrial polyp. Had cervical polyp that was removed and was benign.  Sex activity: single partner, contraception - vasectomy. She does not have vaginal dryness/ no postcoital bleeding.  Last Pap: 08/20/18  Results were: no abnormalities /neg HPV DNA.  Hx of STDs: HPV  Last mammogram: 01/15/20 Results were: normal--routine follow-up in 12 months There is a FH of breast cancer in her MGM. Pt is MyRisk neg 2016. IBIS=17%. There is no FH of ovarian cancer. The patient does do self-breast exams.  Colonoscopy: in her [address] Dr. SGustavo Lahdue to GI issues. Neg colonoscopy. Due for f/u and needs ref per pt.  Tobacco use: The patient denies current or previous tobacco use. Alcohol use: 2 glasses of wine per day(s)  No drug use.  Exercise: very active  She does get adequate calcium but not Vitamin D in her diet.  Meds managed by PCP. Weaning off celexa and doing well. Takes pantoprazole for GERD.    Labs with PCP.  Past Medical History:  Diagnosis Date  . Anxiety   . Barrett's esophagus 09/2012  . 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 Esophageal reflux   . Family history of breast cancer   . GERD (gastroesophageal reflux disease)   . History of abnormal cervical Pap smear    nl since  . History of [place] 09[place]29/[place] History of Papanicolaou smear of cervix 05/14/2015   -/-    Past Surgical History:  Procedure Laterality Date  . CESAREAN SECTION  2006  . CRYOTHERAPY     AGE [age]s   Family History  Problem Relation Age of Onset  . Diabetes Brother   . Breast cancer Maternal Grandmother 35  . Diabetes Maternal Uncle     Social History   Socioeconomic History  . Marital status: Married    Spouse name: Not on file  . Number of children: Not on file  . Years of education: 180 . Highest education level: Not on file  Occupational History  . Occupation: homemaker  Tobacco Use  . Smoking status: Former SResearch scientist (life sciences) . Smokeless tobacco: Never Used  . Tobacco comment: 2004  Vaping Use  . Vaping Use: Never used  Substance and Sexual Activity  . Alcohol use: Yes  . Drug use: No  . Sexual activity: Yes    Birth control/protection: Post-menopausal  Other Topics Concern  . Not on file  Social History Narrative  . Not on file   Social Determinants of Health   Financial Resource Strain: Not  on file  Food Insecurity: Not on file  Transportation Needs: Not on file  Physical Activity: Not on file  Stress: Not on file  Social Connections: Not on file  Intimate Partner Violence: Not on file    Current Outpatient Medications on File Prior to Visit  Medication Sig Dispense Refill  . citalopram (CELEXA) 20 MG tablet Take 20 mg by mouth daily.    . hydrochlorothiazide (HYDRODIURIL) 12.5 MG tablet Take 12.5 mg by mouth daily.    . pantoprazole (PROTONIX) 20 MG tablet TAKE 1 TABLET BY MOUTH  DAILY 90 tablet 3   No current facility-administered medications on file prior to visit.     ROS:  Review of Systems  Constitutional: Negative for fatigue, fever and unexpected  weight change.  Respiratory: Negative for cough, shortness of breath and wheezing.   Cardiovascular: Negative for chest pain, palpitations and leg swelling.  Gastrointestinal: Negative for blood in stool, constipation, diarrhea, nausea and vomiting.  Endocrine: Negative for cold intolerance, heat intolerance and polyuria.  Genitourinary: Positive for menstrual problem. Negative for dyspareunia, dysuria, flank pain, frequency, genital sores, hematuria, pelvic pain, urgency, vaginal bleeding, vaginal discharge and vaginal pain.  Musculoskeletal: Negative for arthralgias, back pain, joint swelling and myalgias.  Skin: Negative for rash.  Neurological: Negative for dizziness, syncope, light-headedness, numbness and headaches.  Hematological: Negative for adenopathy.  Psychiatric/Behavioral: Negative for agitation, confusion, dysphoric mood, sleep disturbance and suicidal ideas. The patient is not nervous/anxious.    Objective: BP 110/80   Ht 5' 4"  (1.626 m)   Wt 195 lb (88.5 kg)   LMP 11/24/2020 (Exact Date)   BMI 33.47 kg/m    Physical Exam Constitutional:      Appearance: She is well-developed.  Genitourinary:     Vulva normal.     Right Labia: No rash, tenderness or lesions.    Left Labia: No tenderness, lesions or rash.    Vaginal bleeding (scant brown d/c only) present.     No vaginal discharge, erythema or tenderness.      Right Adnexa: not tender and no mass present.    Left Adnexa: not tender and no mass present.    No cervical friability or polyp.     Uterus is not enlarged or tender.  Breasts:     Right: No mass, nipple discharge, skin change or tenderness.     Left: No mass, nipple discharge, skin change or tenderness.    Neck:     Thyroid: No thyromegaly.  Cardiovascular:     Rate and Rhythm: Normal rate and regular rhythm.     Heart sounds: Normal heart sounds. No murmur heard.   Pulmonary:     Effort: Pulmonary effort is normal.     Breath sounds: Normal  breath sounds.  Abdominal:     Palpations: Abdomen is soft.     Tenderness: There is no abdominal tenderness. There is no guarding or rebound.  Musculoskeletal:        General: Normal range of motion.     Cervical back: Normal range of motion.  Lymphadenopathy:     Cervical: No cervical adenopathy.  Neurological:     General: No focal deficit present.     Mental Status: She is alert and oriented to person, place, and time.     Cranial Nerves: No cranial nerve deficit.  Skin:    General: Skin is warm and dry.  Psychiatric:        Mood and Affect: Mood normal.  Behavior: Behavior normal.        Thought Content: Thought content normal.        Judgment: Judgment normal.  Vitals reviewed.    Assessment/Plan:  Encounter for annual routine gynecological examination  Cervical cancer screening--check pap due to AUB.   Encounter for screening mammogram for malignant neoplasm of breast - Plan: MM 3D SCREEN BREAST BILATERAL; pt to sched mammo  Family history of breast cancer - Plan: MM 3D SCREEN BREAST BILATERAL; Pt is MyRisk neg, not at increased risk of breast cancer.   Abnormal uterine bleeding (AUB) - Plan: US PELVIS TRANSVAGINAL NON-OB (TV ONLY); almost resolved on exam; sx for 1 mo. Check GYN u/s and pap. Will call with results and dispo.   Perimenopause  Screening for colon cancer - Plan: Ambulatory referral to Gastroenterology;          GYN counsel breast self exam, mammography screening, menopause, adequate intake of calcium and vitamin D, diet and exercise    F/U  Return in about 1 day (around 01/01/2021) for GYN u/s for AUB--[facility] to call pt.  Sara Bentley B. Kavin Weckwerth, PA-C 12/31/2020 10:00 AM

## 2020-12-31 ENCOUNTER — Other Ambulatory Visit: Payer: Self-pay

## 2020-12-31 ENCOUNTER — Ambulatory Visit (INDEPENDENT_AMBULATORY_CARE_PROVIDER_SITE_OTHER): Payer: Managed Care, Other (non HMO) | Admitting: Obstetrics and Gynecology

## 2020-12-31 ENCOUNTER — Other Ambulatory Visit (HOSPITAL_COMMUNITY)
Admission: RE | Admit: 2020-12-31 | Discharge: 2020-12-31 | Disposition: A | Discharge status: Home / Self Care | Payer: Managed Care, Other (non HMO) | Source: Ambulatory Visit | Attending: Obstetrics and Gynecology | Admitting: Obstetrics and Gynecology

## 2020-12-31 ENCOUNTER — Encounter: Payer: Self-pay | Admitting: Obstetrics and Gynecology

## 2020-12-31 VITALS — BP 110/80 | Ht 64.0 in | Wt 195.0 lb

## 2020-12-31 DIAGNOSIS — N951 Menopausal and female climacteric states: Secondary | ICD-10-CM

## 2020-12-31 DIAGNOSIS — Z1231 Encounter for screening mammogram for malignant neoplasm of breast: Secondary | ICD-10-CM | POA: Diagnosis not present

## 2020-12-31 DIAGNOSIS — N939 Abnormal uterine and vaginal bleeding, unspecified: Secondary | ICD-10-CM | POA: Diagnosis not present

## 2020-12-31 DIAGNOSIS — Z124 Encounter for screening for malignant neoplasm of cervix: Secondary | ICD-10-CM | POA: Insufficient documentation

## 2020-12-31 DIAGNOSIS — Z803 Family history of malignant neoplasm of breast: Secondary | ICD-10-CM

## 2020-12-31 DIAGNOSIS — Z01419 Encounter for gynecological examination (general) (routine) without abnormal findings: Secondary | ICD-10-CM

## 2020-12-31 DIAGNOSIS — Z1211 Encounter for screening for malignant neoplasm of colon: Secondary | ICD-10-CM

## 2021-01-01 LAB — CYTOLOGY - PAP
Adequacy: ABSENT
Diagnosis: NEGATIVE

## 2021-01-04 ENCOUNTER — Ambulatory Visit (INDEPENDENT_AMBULATORY_CARE_PROVIDER_SITE_OTHER): Payer: Managed Care, Other (non HMO)

## 2021-01-04 ENCOUNTER — Other Ambulatory Visit: Payer: Self-pay

## 2021-01-04 DIAGNOSIS — N939 Abnormal uterine and vaginal bleeding, unspecified: Secondary | ICD-10-CM

## 2021-01-08 ENCOUNTER — Telehealth: Payer: Self-pay

## 2021-01-08 NOTE — Telephone Encounter (Signed)
Patient is calling to follow up on ultrasound results. Please advise

## 2021-01-08 NOTE — Telephone Encounter (Signed)
Pt aware of GYN u/s results. Has 3 cm RTO simple cyst. Will repeat GYN u/s in 12 wks. Pt to call and sched.  AUB has resolved. F/u if sx recur for EMB.

## 2021-01-20 ENCOUNTER — Ambulatory Visit: Payer: Managed Care, Other (non HMO) | Admitting: Obstetrics and Gynecology

## 2021-02-26 IMAGING — MG DIGITAL SCREENING BILAT W/ TOMO W/ CAD
8 series · 8 of 24 positions shown · non-contrast
Comparison: Previous exam(s).

CLINICAL DATA: Screening.

EXAM:
DIGITAL SCREENING BILATERAL MAMMOGRAM WITH TOMO AND CAD

[R CC synth-2D]
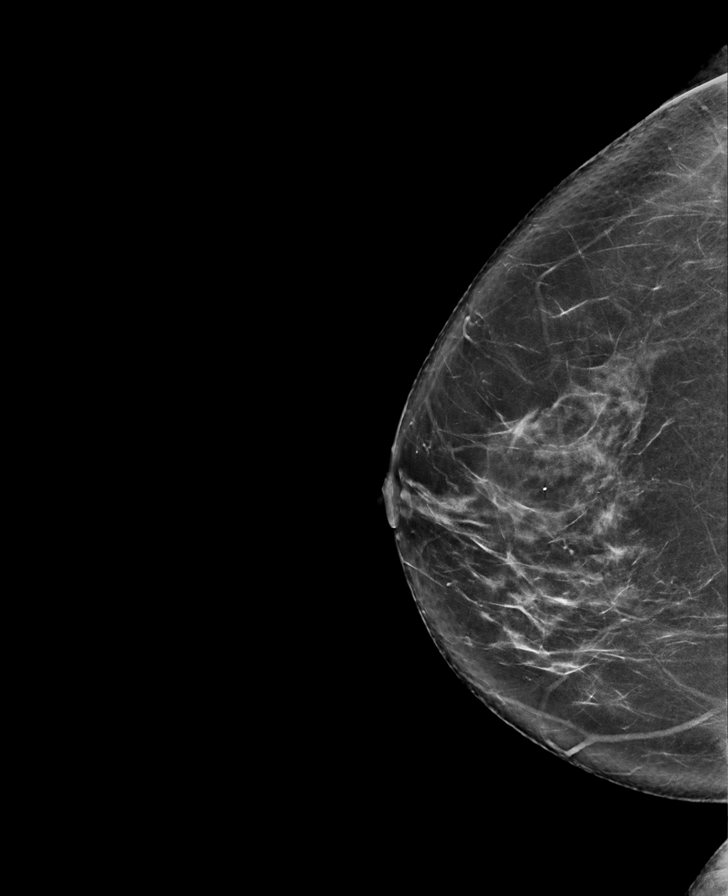

[L MLO synth-2D]
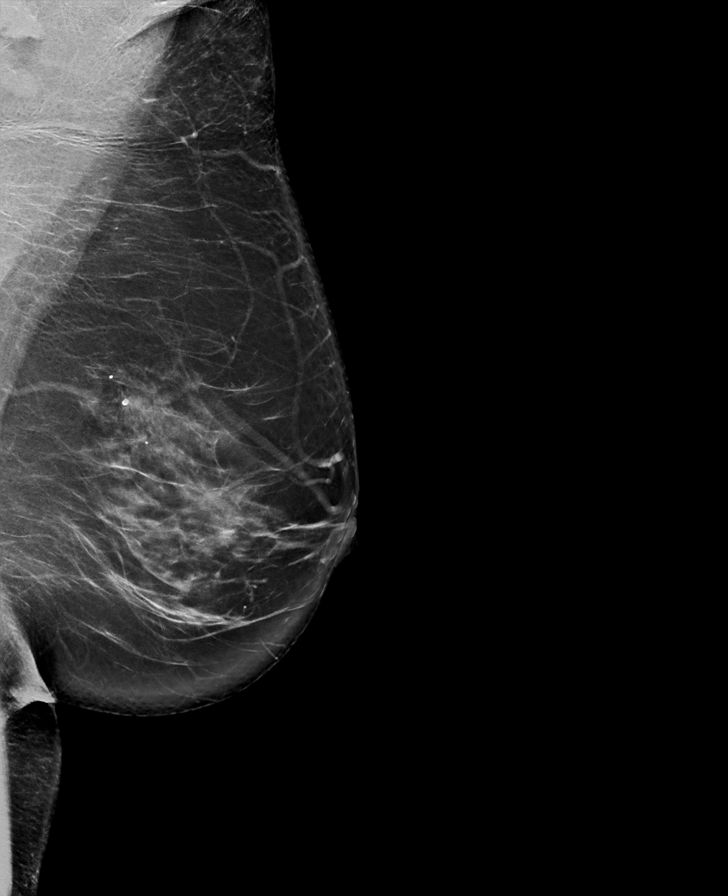

[R MLO synth-2D]
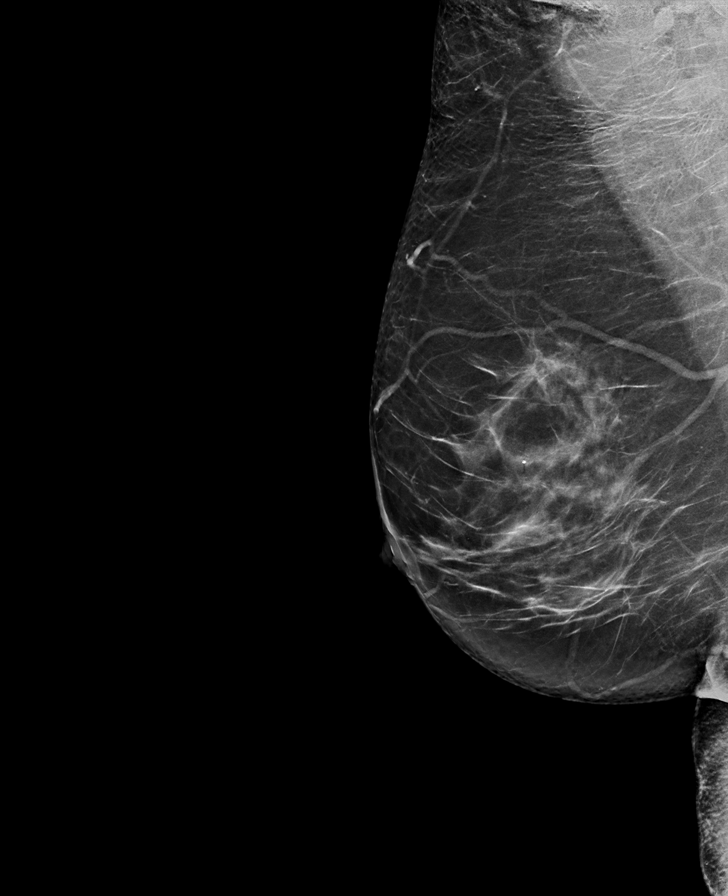

[L CC synth-2D]
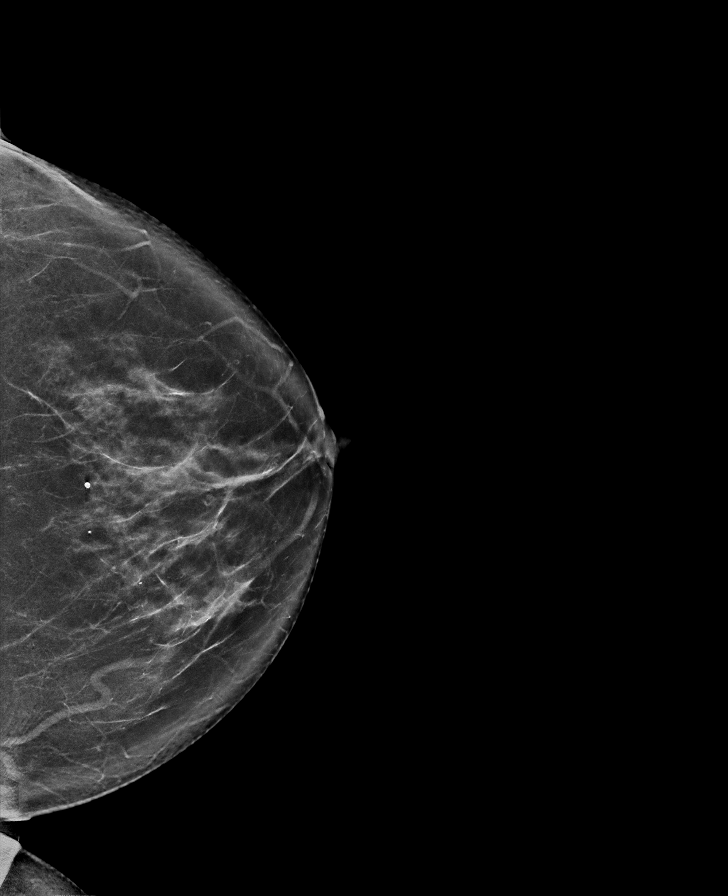

[L CC tomo · tomo slice 46/91.0]
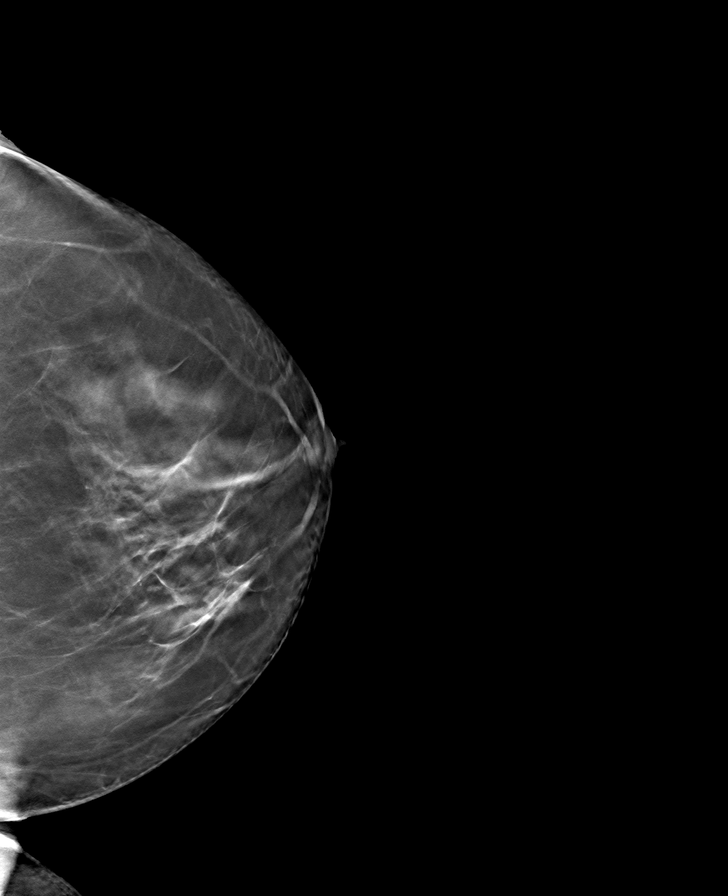

[L MLO tomo · tomo slice 47/94.0]
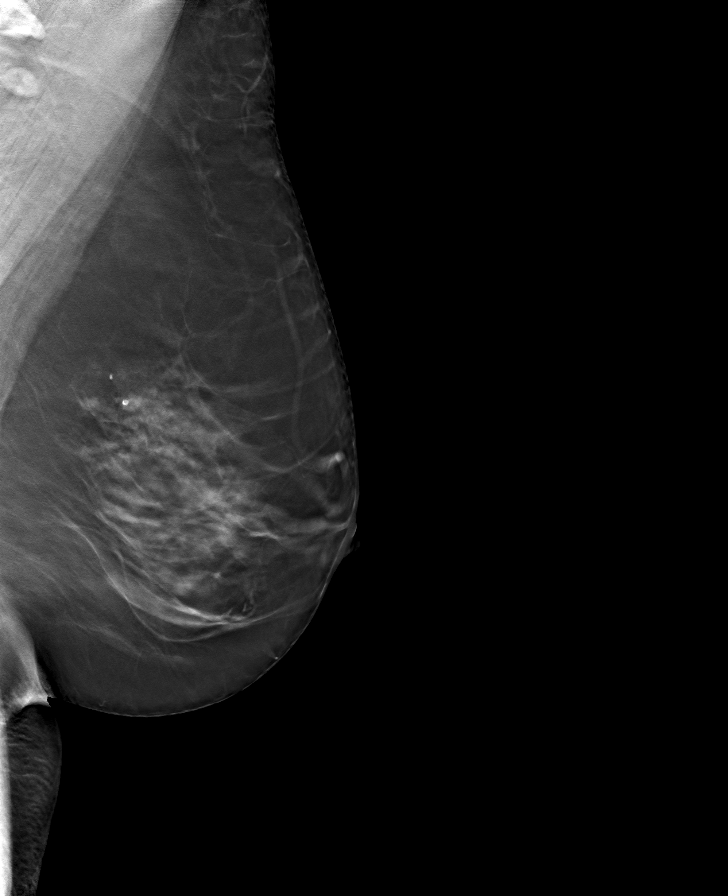

[R MLO tomo · tomo slice 39/78.0]
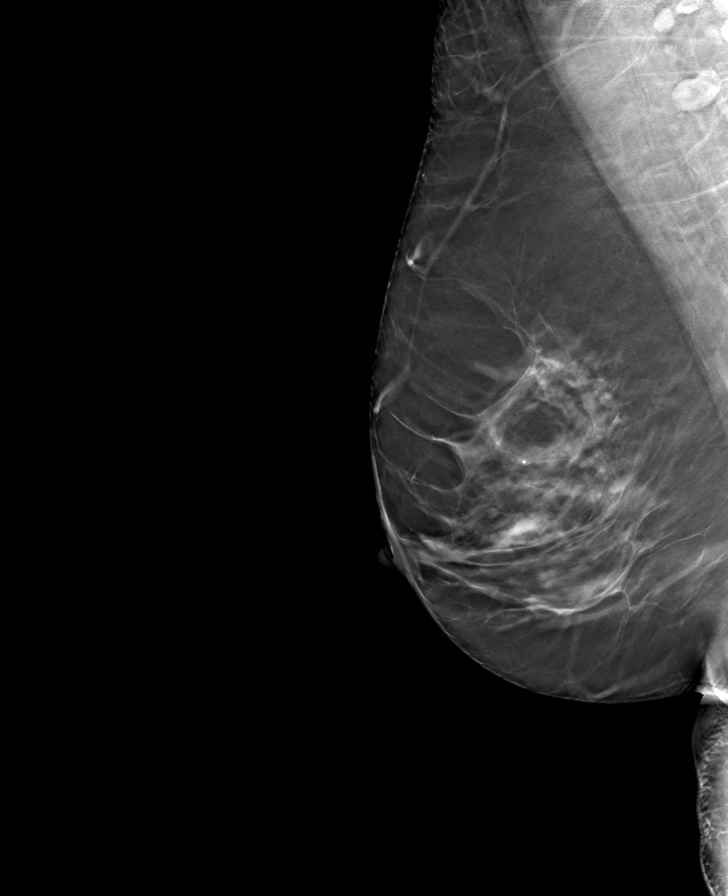

[R CC tomo · tomo slice 42/83.0]
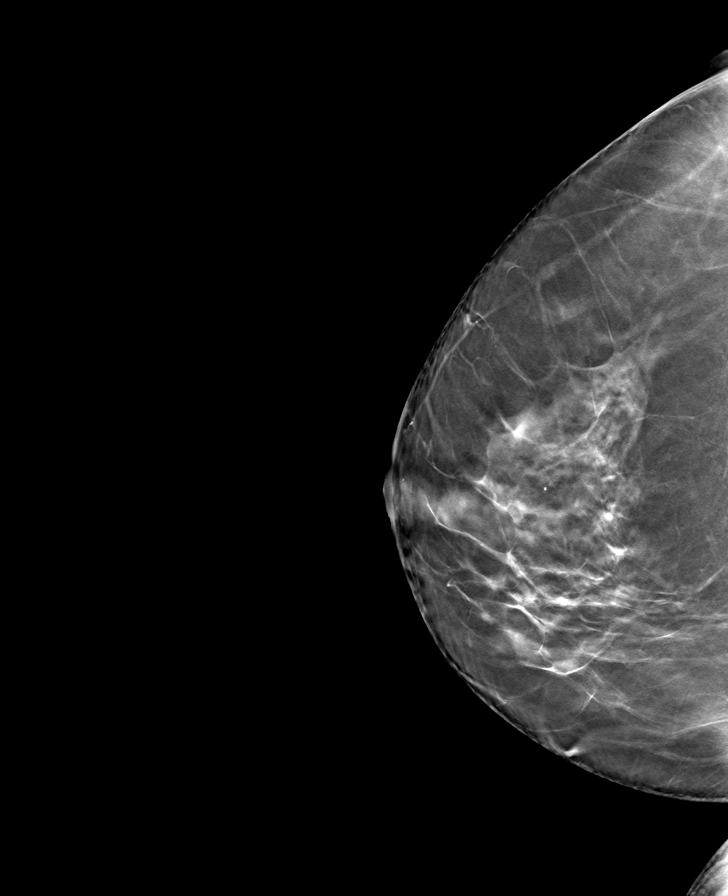

[8 of 24 positions shown; findings below may reference images not displayed]

ACR Breast Density Category c: The breast tissue is heterogeneously
dense, which may obscure small masses.
FINDINGS: There are no findings suspicious for malignancy. Images were
processed with CAD.
IMPRESSION: No mammographic evidence of malignancy. A result letter of this
screening mammogram will be mailed directly to the patient.

RECOMMENDATION:
Screening mammogram in one year. (Code:FT-U-LHB)

BI-RADS CATEGORY  1: Negative.

## 2021-08-21 HISTORY — PX: ESOPHAGOGASTRODUODENOSCOPY: SHX1529

## 2021-08-26 ENCOUNTER — Encounter: Payer: Self-pay | Admitting: Obstetrics and Gynecology

## 2021-08-26 ENCOUNTER — Other Ambulatory Visit: Payer: Self-pay | Admitting: Gastroenterology

## 2021-08-26 HISTORY — PX: COLONOSCOPY: SHX174

## 2021-08-30 LAB — SURGICAL PATHOLOGY

## 2021-09-21 ENCOUNTER — Encounter: Payer: Self-pay | Admitting: Obstetrics and Gynecology

## 2022-06-29 ENCOUNTER — Emergency Department: Payer: Managed Care, Other (non HMO)

## 2022-06-29 ENCOUNTER — Emergency Department
Admission: EM | Admit: 2022-06-29 | Discharge: 2022-06-29 | Disposition: A | Discharge status: Home / Self Care | Payer: Managed Care, Other (non HMO) | Attending: Emergency Medicine | Admitting: Emergency Medicine

## 2022-06-29 DIAGNOSIS — R0789 Other chest pain: Secondary | ICD-10-CM | POA: Insufficient documentation

## 2022-06-29 DIAGNOSIS — R079 Chest pain, unspecified: Secondary | ICD-10-CM

## 2022-06-29 LAB — CBC
HCT: 42.7 % (ref 36.0–46.0)
Hemoglobin: 14.6 g/dL (ref 12.0–15.0)
MCH: 33.7 pg (ref 26.0–34.0)
MCHC: 34.2 g/dL (ref 30.0–36.0)
MCV: 98.6 fL (ref 80.0–100.0)
Platelets: 208 10*3/uL (ref 150–400)
RBC: 4.33 MIL/uL (ref 3.87–5.11)
RDW: 11.8 % (ref 11.5–15.5)
WBC: 5.2 10*3/uL (ref 4.0–10.5)
nRBC: 0 % (ref 0.0–0.2)

## 2022-06-29 LAB — BASIC METABOLIC PANEL
Anion gap: 9 (ref 5–15)
BUN: 18 mg/dL (ref 6–20)
CO2: 25 mmol/L (ref 22–32)
Calcium: 9.5 mg/dL (ref 8.9–10.3)
Chloride: 105 mmol/L (ref 98–111)
Creatinine, Ser: 0.68 mg/dL (ref 0.44–1.00)
GFR, Estimated: >60 mL/min (ref >60)
Glucose, Bld: 90 mg/dL (ref 70–99)
Potassium: 3.9 mmol/L (ref 3.5–5.1)
Sodium: 139 mmol/L (ref 135–145)

## 2022-06-29 LAB — TROPONIN I (HIGH SENSITIVITY)
Troponin I (High Sensitivity): 3 ng/L (ref <18)
Troponin I (High Sensitivity): 2 ng/L (ref <18)

## 2022-06-29 LAB — POC URINE PREG, ED: Preg Test, Ur: NEGATIVE

## 2022-06-29 NOTE — ED Triage Notes (Signed)
Pt sts that she was walking on the tred mill this AM and started to have CP. Pt is CP free at this time however it comes and goes.

## 2022-06-29 NOTE — ED Provider Notes (Signed)
[facility] Provider Note    Event Date/Time   First MD Initiated Contact with Patient 06/29/22 1543     (approximate)   History   Chief Complaint Chest Pain   HPI  Sara Bentley is a 56 y.o. female with past medical history of GERD and anxiety who presents to the ED complaining of chest pain.  Patient reports that she was on a treadmill around noon today when she began having tightness in her chest followed by sharp pain.  She immediately stopped to rest and pain seemed to subside after a couple of minutes.  She denies any associated palpitations or shortness of breath.  She had been feeling well prior to onset of symptoms, now also states she feels back to normal.  She denies any recent fevers, cough, pain or swelling in her legs.  She does not have any significant cardiac history.     Physical Exam   Triage Vital Signs: ED Triage Vitals [06/29/22 1331]  Enc Vitals Group     BP (!) 154/99     Pulse Rate 74     Resp 19     Temp 98.6 F (37 C)     Temp Source Oral     SpO2 100 %     Weight 171 lb (77.6 kg)     Height      Head Circumference      Peak Flow      Pain Score 0     Pain Loc      Pain Edu?      Excl. in [place]?     Most recent vital signs: Vitals:   06/29/22 1331 06/29/22 1737  BP: (!) 154/99 138/87  Pulse: 74 63  Resp: 19 18  Temp: 98.6 F (37 C) 98.4 F (36.9 C)  SpO2: 100% 96%    Constitutional: Alert and oriented. Eyes: Conjunctivae are normal. Head: Atraumatic. Nose: No congestion/rhinnorhea. Mouth/Throat: Mucous membranes are moist.  Cardiovascular: Normal rate, regular rhythm. Grossly normal heart sounds.  2+ radial pulses bilaterally. Respiratory: Normal respiratory effort.  No retractions. Lungs CTAB.  No chest wall tenderness to palpation. Gastrointestinal: Soft and nontender. No distention. Musculoskeletal: No lower extremity tenderness nor edema.  Neurologic:  Normal speech and language. No gross focal  neurologic deficits are appreciated.    ED Results / Procedures / Treatments   Labs (all labs ordered are listed, but only abnormal results are displayed) Labs Reviewed  BASIC METABOLIC PANEL  CBC  POC URINE PREG, ED  TROPONIN I (HIGH SENSITIVITY)  TROPONIN I (HIGH SENSITIVITY)     EKG  ED ECG REPORT I, Blake Divine, the attending physician, personally viewed and interpreted this ECG.   Date: 06/29/2022  EKG Time: 13:28  Rate: 75  Rhythm: normal sinus rhythm  Axis: Normal  Intervals:none  ST&T Change: None  RADIOLOGY Chest x-ray reviewed and interpreted by me with no infiltrate, edema, or effusion.  PROCEDURES:  Critical Care performed: No  Procedures   MEDICATIONS ORDERED IN ED: Medications - No data to display   IMPRESSION / MDM / [place] / ED COURSE  I reviewed the triage vital signs and the nursing notes.                              56 y.o. female with past medical history of GERD and anxiety who presents to the ED following episode of chest tightness and  sharp pain in her chest while on a treadmill.  Patient's presentation is most consistent with acute presentation with potential threat to life or bodily function.  Differential diagnosis includes, but is not limited to, ACS, arrhythmia, PE, pneumonia, pneumothorax, dissection, musculoskeletal pain, GERD, and anxiety.  Patient well-appearing and in no acute distress, vital signs are unremarkable and she denies any ongoing chest pain.  EKG shows no evidence of arrhythmia or ischemia and initial troponin is negative.  Remainder of labs are reassuring with no significant anemia, leukocytosis, electrolyte abnormality, or AKI.  Chest x-ray negative for acute process, does show questionable pulmonary nodule, repeat recommended per radiology with nipple markers and removal of EKG leads.  We will repeat troponin and recheck chest x-ray, reassess following additional results.  Low suspicion for PE or  dissection at this time given resolution of symptoms.  Repeat troponin within normal limits, repeat chest x-ray shows resolution of possible pulmonary nodule, no acute findings noted.  Patient remains asymptomatic here in the ED and is appropriate for discharge home with PCP follow-up.  She was counseled to return to the ED for new or worsening symptoms, patient agrees with plan.      FINAL CLINICAL IMPRESSION(S) / ED DIAGNOSES   Final diagnoses:  Nonspecific chest pain     Rx / DC Orders   ED Discharge Orders     None        Note:  This document was prepared using Dragon voice recognition software and may include unintentional dictation errors.   Blake Divine, MD 06/29/22 [phone]

## 2023-02-01 ENCOUNTER — Other Ambulatory Visit: Payer: Self-pay | Admitting: Family Medicine

## 2023-02-01 DIAGNOSIS — Z13820 Encounter for screening for osteoporosis: Secondary | ICD-10-CM

## 2023-02-01 DIAGNOSIS — Z1231 Encounter for screening mammogram for malignant neoplasm of breast: Secondary | ICD-10-CM

## 2023-03-28 ENCOUNTER — Ambulatory Visit
Admission: RE | Admit: 2023-03-28 | Discharge: 2023-03-28 | Disposition: A | Discharge status: Home / Self Care | Payer: Managed Care, Other (non HMO) | Source: Ambulatory Visit | Attending: Family Medicine | Admitting: Family Medicine

## 2023-03-28 DIAGNOSIS — Z13820 Encounter for screening for osteoporosis: Secondary | ICD-10-CM | POA: Insufficient documentation

## 2023-03-28 DIAGNOSIS — Z1231 Encounter for screening mammogram for malignant neoplasm of breast: Secondary | ICD-10-CM | POA: Insufficient documentation

## 2023-05-08 ENCOUNTER — Other Ambulatory Visit: Payer: Managed Care, Other (non HMO)

## 2023-12-22 ENCOUNTER — Encounter: Payer: Self-pay | Admitting: Nurse Practitioner

## 2023-12-22 ENCOUNTER — Ambulatory Visit: Payer: Managed Care, Other (non HMO) | Admitting: Nurse Practitioner

## 2023-12-22 VITALS — BP 120/84 | HR 77 | Temp 98.1°F | Resp 16 | Ht 64.0 in | Wt 171.8 lb

## 2023-12-22 DIAGNOSIS — Z131 Encounter for screening for diabetes mellitus: Secondary | ICD-10-CM

## 2023-12-22 DIAGNOSIS — L989 Disorder of the skin and subcutaneous tissue, unspecified: Secondary | ICD-10-CM

## 2023-12-22 DIAGNOSIS — Z130 Encounter for screening for diseases of the blood and blood-forming organs and certain disorders involving the immune mechanism: Secondary | ICD-10-CM

## 2023-12-22 DIAGNOSIS — Z13220 Encounter for screening for lipoid disorders: Secondary | ICD-10-CM

## 2023-12-22 DIAGNOSIS — Z114 Encounter for screening for human immunodeficiency virus [HIV]: Secondary | ICD-10-CM

## 2023-12-22 DIAGNOSIS — Z7689 Persons encountering health services in other specified circumstances: Secondary | ICD-10-CM

## 2023-12-22 DIAGNOSIS — Z1159 Encounter for screening for other viral diseases: Secondary | ICD-10-CM

## 2023-12-22 DIAGNOSIS — E663 Overweight: Secondary | ICD-10-CM

## 2023-12-22 DIAGNOSIS — Z803 Family history of malignant neoplasm of breast: Secondary | ICD-10-CM | POA: Insufficient documentation

## 2023-12-22 NOTE — Progress Notes (Signed)
BP 120/84   Pulse 77   Temp 98.1 F (36.7 C)   Resp 16   Ht 5\' 4"  (1.626 m)   Wt 171 lb 12.8 oz (77.9 kg)   LMP 11/24/2020 (Exact Date)   SpO2 95%   BMI 29.49 kg/m    Subjective:    Patient ID: Sara Bentley, female    DOB: 30-Nov-1965, 58 y.o.   MRN: [id number]  HPI: Sara Bentley is a 58 y.o. female  Chief Complaint  Patient presents with   Establish Care    Discussed the use of AI scribe software for clinical note transcription with the patient, who gave verbal consent to proceed.  History of Present Illness   The patient, with a history of acid reflux managed with omeprazole 20mg  daily, presents to establish care. She reports a family history significant for lung cancer, diabetes, and breast cancer, but she is BRCA gene negative. The patient's last blood work was approximately a year ago, with a noted improvement in previously elevated cholesterol levels.  The patient expresses concern about her weight, despite maintaining an active lifestyle with daily exercise, including walking up to five miles. She also reports a significant reduction in alcohol consumption. The patient expresses frustration with her current weight plateau and is interested in exploring weight loss options, including medications.       12/22/2023    1:19 PM  Depression screen PHQ 2/9  Decreased Interest 0  Down, Depressed, Hopeless 0  PHQ - 2 Score 0  Altered sleeping 0  Tired, decreased energy 0  Change in appetite 0  Feeling bad or failure about yourself  0  Trouble concentrating 0  Moving slowly or fidgety/restless 0  Suicidal thoughts 0  PHQ-9 Score 0  Difficult doing work/chores Not difficult at all       12/22/2023    1:19 PM  GAD 7 : Generalized Anxiety Score  Nervous, Anxious, on Edge 0  Control/stop worrying 0  Worry too much - different things 0  Trouble relaxing 0  Restless 0  Easily annoyed or irritable 0  Afraid - awful might happen 0  Total GAD 7 Score 0   Anxiety Difficulty Not difficult at all     Relevant past medical, surgical, family and social history reviewed and updated as indicated. Interim medical history since our last visit reviewed. Allergies and medications reviewed and updated.  Review of Systems  Constitutional: Negative for fever or weight change.  Respiratory: Negative for cough and shortness of breath.   Cardiovascular: Negative for chest pain or palpitations.  Gastrointestinal: Negative for abdominal pain, no bowel changes.  Musculoskeletal: Negative for gait problem or joint swelling.  Skin: Negative for rash.  Neurological: Negative for dizziness or headache.  No other specific complaints in a complete review of systems (except as listed in HPI above).      Objective:    BP 120/84   Pulse 77   Temp 98.1 F (36.7 C)   Resp 16   Ht 5\' 4"  (1.626 m)   Wt 171 lb 12.8 oz (77.9 kg)   LMP 11/24/2020 (Exact Date)   SpO2 95%   BMI 29.49 kg/m    Wt Readings from Last 3 Encounters:  12/22/23 171 lb 12.8 oz (77.9 kg)  06/29/22 171 lb (77.6 kg)  12/31/20 195 lb (88.5 kg)    Physical Exam Vitals reviewed.  Constitutional:      Appearance: Normal appearance.  HENT:     Head: Normocephalic.  Cardiovascular:     Rate and Rhythm: Normal rate and regular rhythm.  Pulmonary:     Effort: Pulmonary effort is normal.     Breath sounds: Normal breath sounds.  Musculoskeletal:        General: Normal range of motion.  Skin:    General: Skin is warm and dry.  Neurological:     General: No focal deficit present.     Mental Status: She is alert and oriented to person, place, and time. Mental status is at baseline.  Psychiatric:        Mood and Affect: Mood normal.        Behavior: Behavior normal.        Thought Content: Thought content normal.        Judgment: Judgment normal.     Results for orders placed or performed during the hospital encounter of 06/29/22  Basic metabolic panel   Collection Time:  06/29/22  1:37 PM  Result Value Ref Range   Sodium 139 135 - 145 mmol/L   Potassium 3.9 3.5 - 5.1 mmol/L   Chloride 105 98 - 111 mmol/L   CO2 25 22 - 32 mmol/L   Glucose, Bld 90 70 - 99 mg/dL   BUN 18 6 - 20 mg/dL   Creatinine, Ser 1.30 0.44 - 1.00 mg/dL   Calcium 9.5 8.9 - 86.5 mg/dL   GFR, Estimated >78 >46 mL/min   Anion gap 9 5 - 15  CBC   Collection Time: 06/29/22  1:37 PM  Result Value Ref Range   WBC 5.2 4.0 - 10.5 K/uL   RBC 4.33 3.87 - 5.11 MIL/uL   Hemoglobin 14.6 12.0 - 15.0 g/dL   HCT 96.2 95.2 - 84.1 %   MCV 98.6 80.0 - 100.0 fL   MCH 33.7 26.0 - 34.0 pg   MCHC 34.2 30.0 - 36.0 g/dL   RDW 32.4 40.1 - 02.7 %   Platelets 208 150 - 400 K/uL   nRBC 0.0 0.0 - 0.2 %  Troponin I (High Sensitivity)   Collection Time: 06/29/22  1:37 PM  Result Value Ref Range   Troponin I (High Sensitivity) 3 <18 ng/L  POC urine preg, ED   Collection Time: 06/29/22  3:30 PM  Result Value Ref Range   Preg Test, Ur Negative Negative  Troponin I (High Sensitivity)   Collection Time: 06/29/22  4:18 PM  Result Value Ref Range   Troponin I (High Sensitivity) 2 <18 ng/L       Assessment & Plan:   Problem List Items Addressed This Visit       Other   Family history of breast cancer   Other Visit Diagnoses       Encounter to establish care    -  Primary     Screening for deficiency anemia       Relevant Orders   CBC with Differential/Platelet     Screening for cholesterol level       Relevant Orders   Lipid panel     Screening for diabetes mellitus       Relevant Orders   Comprehensive metabolic panel   Hemoglobin A1c     Encounter for hepatitis C screening test for low risk patient       Relevant Orders   Hepatitis C antibody     Screening for HIV without presence of risk factors       Relevant Orders   HIV Antibody (routine testing w rflx)  Overweight       Relevant Orders   Thyroid Panel With TSH     Skin abnormality       Relevant Orders   Ambulatory referral  to Dermatology        Assessment and Plan    Weight Management Patient reports difficulty with weight loss despite regular exercise and dietary modifications. Discussed potential medical interventions including Zepbound/Mounjaro and Ozempic/Wegovy injections, as well as Contrave oral medication. Pending lab results to determine eligibility. -Order comprehensive metabolic panel, lipid panel, and thyroid function tests. -Discuss results and potential weight loss interventions once lab results are available.  Gastroesophageal Reflux Disease (GERD) Currently managed with Omeprazole 20mg  daily. -Continue Omeprazole 20mg  daily.  General Health Maintenance: -Order routine labs for health maintenance. -Refer to dermatology for routine skin check -Continue annual mammograms and bone density tests as previously done.  Family History of Cancer Noted family history of lung, breast cancer. Patient is BRCA gene negative. -Continue routine cancer screenings as appropriate based on age and family history.        Follow up plan: Return for cpe.

## 2023-12-26 ENCOUNTER — Encounter: Payer: Self-pay | Admitting: Nurse Practitioner

## 2023-12-26 LAB — CBC WITH DIFFERENTIAL/PLATELET
Basophils Absolute: 0 10*3/uL (ref 0.0–0.2)
Basos: 1 %
EOS (ABSOLUTE): 0 10*3/uL (ref 0.0–0.4)
Eos: 0 %
Hematocrit: 41 % (ref 34.0–46.6)
Hemoglobin: 13.9 g/dL (ref 11.1–15.9)
Immature Grans (Abs): 0 10*3/uL (ref 0.0–0.1)
Immature Granulocytes: 0 %
Lymphocytes Absolute: 1.3 10*3/uL (ref 0.7–3.1)
Lymphs: 31 %
MCH: 33.9 pg — ABNORMAL HIGH (ref 26.6–33.0)
MCHC: 33.9 g/dL (ref 31.5–35.7)
MCV: 100 fL — ABNORMAL HIGH (ref 79–97)
Monocytes Absolute: 0.2 10*3/uL (ref 0.1–0.9)
Monocytes: 6 %
Neutrophils Absolute: 2.6 10*3/uL (ref 1.4–7.0)
Neutrophils: 62 %
Platelets: 167 10*3/uL (ref 150–450)
RBC: 4.1 x10E6/uL (ref 3.77–5.28)
RDW: 11.8 % (ref 11.7–15.4)
WBC: 4.2 10*3/uL (ref 3.4–10.8)

## 2023-12-26 LAB — LIPID PANEL
Chol/HDL Ratio: 2.9 {ratio} (ref 0.0–4.4)
Cholesterol, Total: 160 mg/dL (ref 100–199)
HDL: 55 mg/dL (ref >39)
LDL Chol Calc (NIH): 77 mg/dL (ref 0–99)
Triglycerides: 162 mg/dL — ABNORMAL HIGH (ref 0–149)
VLDL Cholesterol Cal: 28 mg/dL (ref 5–40)

## 2023-12-26 LAB — COMPREHENSIVE METABOLIC PANEL
ALT: 21 [IU]/L (ref 0–32)
AST: 19 [IU]/L (ref 0–40)
Albumin: 4.3 g/dL (ref 3.8–4.9)
Alkaline Phosphatase: 75 [IU]/L (ref 44–121)
BUN/Creatinine Ratio: 23 (ref 9–23)
BUN: 15 mg/dL (ref 6–24)
Bilirubin Total: 0.5 mg/dL (ref 0.0–1.2)
CO2: 21 mmol/L (ref 20–29)
Calcium: 9.2 mg/dL (ref 8.7–10.2)
Chloride: 105 mmol/L (ref 96–106)
Creatinine, Ser: 0.64 mg/dL (ref 0.57–1.00)
Globulin, Total: 2.2 g/dL (ref 1.5–4.5)
Glucose: 83 mg/dL (ref 70–99)
Potassium: 4.3 mmol/L (ref 3.5–5.2)
Sodium: 141 mmol/L (ref 134–144)
Total Protein: 6.5 g/dL (ref 6.0–8.5)
eGFR: 103 mL/min/{1.73_m2} (ref >59)

## 2023-12-26 LAB — THYROID PANEL WITH TSH
Free Thyroxine Index: 1.9 (ref 1.2–4.9)
T3 Uptake Ratio: 30 % (ref 24–39)
T4, Total: 6.4 ug/dL (ref 4.5–12.0)
TSH: 1.07 u[IU]/mL (ref 0.450–4.500)

## 2023-12-26 LAB — HEPATITIS C ANTIBODY: Hep C Virus Ab: NONREACTIVE

## 2023-12-26 LAB — HEMOGLOBIN A1C
Est. average glucose Bld gHb Est-mCnc: 111 mg/dL
Hgb A1c MFr Bld: 5.5 % (ref 4.8–5.6)

## 2023-12-26 LAB — HIV ANTIBODY (ROUTINE TESTING W REFLEX): HIV Screen 4th Generation wRfx: NONREACTIVE

## 2024-01-23 ENCOUNTER — Ambulatory Visit: Payer: Self-pay | Admitting: Pediatrics

## 2024-01-26 ENCOUNTER — Ambulatory Visit: Payer: Self-pay | Admitting: Nurse Practitioner

## 2024-02-22 ENCOUNTER — Other Ambulatory Visit: Payer: Self-pay | Admitting: Nurse Practitioner

## 2024-02-22 DIAGNOSIS — Z1231 Encounter for screening mammogram for malignant neoplasm of breast: Secondary | ICD-10-CM

## 2024-02-28 ENCOUNTER — Encounter: Payer: Self-pay | Admitting: Nurse Practitioner

## 2024-02-28 ENCOUNTER — Ambulatory Visit: Payer: Managed Care, Other (non HMO) | Admitting: Nurse Practitioner

## 2024-02-28 ENCOUNTER — Other Ambulatory Visit (HOSPITAL_COMMUNITY)
Admission: RE | Admit: 2024-02-28 | Discharge: 2024-02-28 | Disposition: A | Discharge status: Home / Self Care | Source: Ambulatory Visit | Attending: Nurse Practitioner | Admitting: Nurse Practitioner

## 2024-02-28 VITALS — BP 116/74 | HR 94 | Temp 98.0°F | Resp 18 | Ht 64.0 in | Wt 170.3 lb

## 2024-02-28 DIAGNOSIS — Z124 Encounter for screening for malignant neoplasm of cervix: Secondary | ICD-10-CM | POA: Diagnosis present

## 2024-02-28 DIAGNOSIS — Z0000 Encounter for general adult medical examination without abnormal findings: Secondary | ICD-10-CM

## 2024-02-28 NOTE — Progress Notes (Signed)
 Name: Sara Bentley   MRN: [id number]    DOB: 1966-02-05   Date:02/28/2024       Progress Note  Subjective  Chief Complaint  Chief Complaint  Patient presents with   Annual Exam    W/ pap    HPI  Patient presents for annual CPE.  Diet: well balanced diet Exercise: walks, and weight lifting  Sleep: 6-8 hours Last dental exam:Jan 2025 Last eye exam: Scheduled in may  Health Maintenance  Topic Date Due   Pap with HPV screening  01/01/2024   COVID-19 Vaccine (3 - 2024-25 season) 03/15/2024*   Zoster (Shingles) Vaccine (1 of 2) 03/20/2024*   DTaP/Tdap/Td vaccine (1 - Tdap) 12/21/2024*   Flu Shot  06/21/2024   Mammogram  03/27/2025   Colon Cancer Screening  08/27/2031   Hepatitis C Screening  Completed   HIV Screening  Completed   HPV Vaccine  Aged Out  *Topic was postponed. The date shown is not the original due date.    Flowsheet Row Office Visit from 12/22/2023 in [facility]  AUDIT-C Score 5       Depression: Phq 9 is  negative    12/22/2023    1:19 PM  Depression screen PHQ 2/9  Decreased Interest 0  Down, Depressed, Hopeless 0  PHQ - 2 Score 0  Altered sleeping 0  Tired, decreased energy 0  Change in appetite 0  Feeling bad or failure about yourself  0  Trouble concentrating 0  Moving slowly or fidgety/restless 0  Suicidal thoughts 0  PHQ-9 Score 0  Difficult doing work/chores Not difficult at all   Hypertension: BP Readings from Last 3 Encounters:  02/28/24 116/74  12/22/23 120/84  06/29/22 138/87   Obesity: Wt Readings from Last 3 Encounters:  02/28/24 170 lb 4.8 oz (77.2 kg)  12/22/23 171 lb 12.8 oz (77.9 kg)  06/29/22 171 lb (77.6 kg)   BMI Readings from Last 3 Encounters:  02/28/24 29.23 kg/m  12/22/23 29.49 kg/m  06/29/22 29.35 kg/m     Vaccines:  HPV: up to at age 54 , ask insurance if age between 47-45  Shingrix: 3-64 yo and ask insurance if covered when patient above 65 yo Pneumonia:  educated and  discussed with patient. Flu:  educated and discussed with patient.  Hep C Screening: completed STD testing and prevention (HIV/chl/gon/syphilis): completed Intimate partner violence:none Sexual History : sexually active Menstrual History/LMP/Abnormal Bleeding: postmenopausal Incontinence Symptoms: none  Breast cancer:  - Last Mammogram: 03/28/2023, scheduled on 03/28/2024 - BRCA gene screening: none  Osteoporosis: Discussed high calcium and vitamin D supplementation, weight bearing exercises  Cervical cancer screening: due today  Skin cancer: Discussed monitoring for atypical lesions  Colorectal cancer: 08/26/2021   Lung cancer:   Low Dose CT Chest recommended if Age 82-80 years, 20 pack-year currently smoking OR have quit w/in 15years. Patient does not qualify.   ECG: 06/30/2022  Advanced Care Planning: A voluntary discussion about advance care planning including the explanation and discussion of advance directives.  Discussed health care proxy and Living will, and the patient was able to identify a health care proxy as husband.  Patient does not have a living will at present time. If patient does have living will, I have requested they bring this to the clinic to be scanned in to their chart.  Lipids: Lab Results  Component Value Date   CHOL 160 12/25/2023   Lab Results  Component Value Date   HDL 55 12/25/2023  Lab Results  Component Value Date   LDLCALC 77 12/25/2023   Lab Results  Component Value Date   TRIG 162 (H) 12/25/2023   Lab Results  Component Value Date   CHOLHDL 2.9 12/25/2023   No results found for: "LDLDIRECT"  Glucose: Glucose  Date Value Ref Range Status  12/25/2023 83 70 - 99 mg/dL Final   Glucose, Bld  Date Value Ref Range Status  06/29/2022 90 70 - 99 mg/dL Final    Comment:    Glucose reference range applies only to samples taken after fasting for at least 8 hours.  12/17/2017 99 65 - 99 mg/dL Final    Patient Active Problem List    Diagnosis Date Noted   Family history of breast cancer    GERD (gastroesophageal reflux disease) 08/22/2019   Anxiety and depression 08/22/2019   Endocervical polyp 08/22/2019   Cysts of both ovaries 08/22/2019   Other insomnia 08/20/2018   Anxiety     Past Surgical History:  Procedure Laterality Date   CESAREAN SECTION  2006   COLONOSCOPY  08/26/2021   Dr. Mia Creek; neg; repeat in 10 yrs   CRYOTHERAPY     AGE 21s   ESOPHAGOGASTRODUODENOSCOPY  08/2021   Dr. Mia Creek; neg    Family History  Problem Relation Age of Onset   Lung cancer Father    Diabetes Brother    Diabetes Maternal Uncle    Breast cancer Maternal Grandmother 35   Cancer Maternal Grandmother     Social History   Socioeconomic History   Marital status: Married    Spouse name: Not on file   Number of children: Not on file   Years of education: 16   Highest education level: Bachelor's degree (e.g., BA, AB, BS)  Occupational History   Occupation: homemaker  Tobacco Use   Smoking status: Former    Current packs/day: 1.00    Types: Cigarettes   Smokeless tobacco: Never   Tobacco comments:    2004  Vaping Use   Vaping status: Never Used  Substance and Sexual Activity   Alcohol use: Yes   Drug use: No   Sexual activity: Yes    Birth control/protection: Post-menopausal  Other Topics Concern   Not on file  Social History Narrative   Not on file   Social Drivers of Health   Financial Resource Strain: Low Risk  (12/20/2023)   Overall Financial Resource Strain (CARDIA)    Difficulty of Paying Living Expenses: Not hard at all  Food Insecurity: No Food Insecurity (12/20/2023)   Hunger Vital Sign    Worried About Running Out of Food in the Last Year: Never true    Ran Out of Food in the Last Year: Never true  Transportation Needs: No Transportation Needs (12/20/2023)   PRAPARE - Administrator, Civil Service (Medical): No    Lack of Transportation (Non-Medical): No  Physical Activity:  Sufficiently Active (12/20/2023)   Exercise Vital Sign    Days of Exercise per Week: 7 days    Minutes of Exercise per Session: 30 min  Stress: Stress Concern Present (12/20/2023)   [employer] of Occupational Health - Occupational Stress Questionnaire    Feeling of Stress : To some extent  Social Connections: Moderately Integrated (12/20/2023)   Social Connection and Isolation Panel [NHANES]    Frequency of Communication with Friends and Family: Once a week    Frequency of Social Gatherings with Friends and Family: Three times a week  Attends Religious Services: More than 4 times per year    Active Member of Clubs or Organizations: No    Attends Engineer, structural: Not on file    Marital Status: Married  Catering manager Violence: Not on file     Current Outpatient Medications:    omeprazole (PRILOSEC) 20 MG capsule, Take 20 mg by mouth daily., Disp: , Rfl:   No Known Allergies   ROS  Constitutional: Negative for fever or weight change.  Respiratory: Negative for cough and shortness of breath.   Cardiovascular: Negative for chest pain or palpitations.  Gastrointestinal: Negative for abdominal pain, no bowel changes.  Musculoskeletal: Negative for gait problem or joint swelling.  Skin: Negative for rash.  Neurological: Negative for dizziness or headache.  No other specific complaints in a complete review of systems (except as listed in HPI above).   Objective  Vitals:   02/28/24 1305  BP: 116/74  Pulse: 94  Resp: 18  Temp: 98 F (36.7 C)  SpO2: 95%  Weight: 170 lb 4.8 oz (77.2 kg)  Height: 5\' 4"  (1.626 m)    Body mass index is 29.23 kg/m.  Physical Exam Vitals reviewed. Exam conducted with a chaperone present.  Constitutional:      Appearance: Normal appearance.  HENT:     Head: Normocephalic.     Right Ear: Tympanic membrane normal.     Left Ear: Tympanic membrane normal.     Nose: Nose normal.  Eyes:     Extraocular Movements:  Extraocular movements intact.     Conjunctiva/sclera: Conjunctivae normal.     Pupils: Pupils are equal, round, and reactive to light.  Neck:     Thyroid: No thyroid mass, thyromegaly or thyroid tenderness.  Cardiovascular:     Rate and Rhythm: Normal rate and regular rhythm.     Pulses: Normal pulses.     Heart sounds: Normal heart sounds.  Pulmonary:     Effort: Pulmonary effort is normal.     Breath sounds: Normal breath sounds.  Chest:  Breasts:    Right: Normal.     Left: Normal.  Abdominal:     General: Bowel sounds are normal.     Palpations: Abdomen is soft.  Genitourinary:    Vagina: Normal.     Cervix: Normal.     Uterus: Normal.      Adnexa: Right adnexa normal and left adnexa normal.     Rectum: Normal.  Musculoskeletal:        General: Normal range of motion.     Cervical back: Normal range of motion and neck supple.     Right lower leg: No edema.     Left lower leg: No edema.  Skin:    General: Skin is warm and dry.     Capillary Refill: Capillary refill takes less than 2 seconds.  Neurological:     General: No focal deficit present.     Mental Status: She is alert and oriented to person, place, and time. Mental status is at baseline.  Psychiatric:        Mood and Affect: Mood normal.        Behavior: Behavior normal.        Thought Content: Thought content normal.        Judgment: Judgment normal.      Recent Results (from the past 2160 hours)  CBC with Differential/Platelet     Status: Abnormal   Collection Time: 12/25/23  9:53 AM  Result Value  Ref Range   WBC 4.2 3.4 - 10.8 x10E3/uL   RBC 4.10 3.77 - 5.28 x10E6/uL   Hemoglobin 13.9 11.1 - 15.9 g/dL   Hematocrit 16.1 09.6 - 46.6 %   MCV 100 (H) 79 - 97 fL   MCH 33.9 (H) 26.6 - 33.0 pg   MCHC 33.9 31.5 - 35.7 g/dL   RDW 04.5 40.9 - 81.1 %   Platelets 167 150 - 450 x10E3/uL   Neutrophils 62 Not Estab. %   Lymphs 31 Not Estab. %   Monocytes 6 Not Estab. %   Eos 0 Not Estab. %   Basos 1 Not  Estab. %   Neutrophils Absolute 2.6 1.4 - 7.0 x10E3/uL   Lymphocytes Absolute 1.3 0.7 - 3.1 x10E3/uL   Monocytes Absolute 0.2 0.1 - 0.9 x10E3/uL   EOS (ABSOLUTE) 0.0 0.0 - 0.4 x10E3/uL   Basophils Absolute 0.0 0.0 - 0.2 x10E3/uL   Immature Granulocytes 0 Not Estab. %   Immature Grans (Abs) 0.0 0.0 - 0.1 x10E3/uL  Comprehensive metabolic panel     Status: None   Collection Time: 12/25/23  9:53 AM  Result Value Ref Range   Glucose 83 70 - 99 mg/dL   BUN 15 6 - 24 mg/dL   Creatinine, Ser 9.14 0.57 - 1.00 mg/dL   eGFR 782 >95 AO/ZHY/8.65   BUN/Creatinine Ratio 23 9 - 23   Sodium 141 134 - 144 mmol/L   Potassium 4.3 3.5 - 5.2 mmol/L   Chloride 105 96 - 106 mmol/L   CO2 21 20 - 29 mmol/L   Calcium 9.2 8.7 - 10.2 mg/dL   Total Protein 6.5 6.0 - 8.5 g/dL   Albumin 4.3 3.8 - 4.9 g/dL   Globulin, Total 2.2 1.5 - 4.5 g/dL   Bilirubin Total 0.5 0.0 - 1.2 mg/dL   Alkaline Phosphatase 75 44 - 121 IU/L   AST 19 0 - 40 IU/L   ALT 21 0 - 32 IU/L  Lipid panel     Status: Abnormal   Collection Time: 12/25/23  9:53 AM  Result Value Ref Range   Cholesterol, Total 160 100 - 199 mg/dL   Triglycerides 784 (H) 0 - 149 mg/dL   HDL 55 >69 mg/dL   VLDL Cholesterol Cal 28 5 - 40 mg/dL   LDL Chol Calc (NIH) 77 0 - 99 mg/dL   Chol/HDL Ratio 2.9 0.0 - 4.4 ratio    Comment:                                   T. Chol/HDL Ratio                                             Men  Women                               1/2 Avg.Risk  3.4    3.3                                   Avg.Risk  5.0    4.4  2X Avg.Risk  9.6    7.1                                3X Avg.Risk 23.4   11.0   Hemoglobin A1c     Status: None   Collection Time: 12/25/23  9:53 AM  Result Value Ref Range   Hgb A1c MFr Bld 5.5 4.8 - 5.6 %    Comment:          Prediabetes: 5.7 - 6.4          Diabetes: >6.4          Glycemic control for adults with diabetes: <7.0    Est. average glucose Bld gHb Est-mCnc 111 mg/dL   Hepatitis C antibody     Status: None   Collection Time: 12/25/23  9:53 AM  Result Value Ref Range   Hep C Virus Ab Non Reactive Non Reactive    Comment: HCV antibody alone does not differentiate between previously resolved infection and active infection. Equivocal and Reactive HCV antibody results should be followed up with an HCV RNA test to support the diagnosis of active HCV infection.   HIV Antibody (routine testing w rflx)     Status: None   Collection Time: 12/25/23  9:53 AM  Result Value Ref Range   HIV Screen 4th Generation wRfx Non Reactive Non Reactive    Comment: HIV-1/HIV-2 antibodies and HIV-1 p24 antigen were NOT detected. There is no laboratory evidence of HIV infection. HIV Negative   Thyroid Panel With TSH     Status: None   Collection Time: 12/25/23  9:53 AM  Result Value Ref Range   TSH 1.070 0.450 - 4.500 uIU/mL   T4, Total 6.4 4.5 - 12.0 ug/dL   T3 Uptake Ratio 30 24 - 39 %   Free Thyroxine Index 1.9 1.2 - 4.9      Fall Risk:    02/28/2024    1:05 PM 12/22/2023    1:18 PM  Fall Risk   Falls in the past year? 0 0  Number falls in past yr: 0 0  Injury with Fall? 0 0  Follow up Falls evaluation completed      Functional Status Survey: Is the patient deaf or have difficulty hearing?: No Does the patient have difficulty seeing, even when wearing glasses/contacts?: No Does the patient have difficulty concentrating, remembering, or making decisions?: No Does the patient have difficulty walking or climbing stairs?: No Does the patient have difficulty dressing or bathing?: No Does the patient have difficulty doing errands alone such as visiting a doctor's office or shopping?: No   Assessment & Plan  1. Annual physical exam (Primary) -recently had labs done - Cytology - PAP  2. Screening for cervical cancer - patient was very sensitive during pap, discussed with patient that we may not have gotten enough cells for pap. - Cytology - PAP   -USPSTF  grade A and B recommendations reviewed with patient; age-appropriate recommendations, preventive care, screening tests, etc discussed and encouraged; healthy living encouraged; see AVS for patient education given to patient -Discussed importance of 150 minutes of physical activity weekly, eat two servings of fish weekly, eat one serving of tree nuts ( cashews, pistachios, pecans, almonds.Marland Kitchen) every other day, eat 6 servings of fruit/vegetables daily and drink plenty of water and avoid sweet beverages.   -Reviewed Health Maintenance: yes

## 2024-03-05 LAB — CYTOLOGY - PAP
Adequacy: ABSENT
Comment: NEGATIVE
Diagnosis: UNDETERMINED — AB
High risk HPV: NEGATIVE

## 2024-03-11 ENCOUNTER — Encounter: Payer: Self-pay | Admitting: Nurse Practitioner

## 2024-03-28 ENCOUNTER — Ambulatory Visit
Admission: RE | Admit: 2024-03-28 | Discharge: 2024-03-28 | Disposition: A | Discharge status: Home / Self Care | Source: Ambulatory Visit | Attending: Nurse Practitioner | Admitting: Nurse Practitioner

## 2024-03-28 DIAGNOSIS — Z1231 Encounter for screening mammogram for malignant neoplasm of breast: Secondary | ICD-10-CM

## 2024-06-17 ENCOUNTER — Encounter: Payer: Self-pay | Admitting: Nurse Practitioner

## 2024-06-26 ENCOUNTER — Ambulatory Visit: Admitting: Nurse Practitioner

## 2024-06-26 ENCOUNTER — Encounter: Payer: Self-pay | Admitting: Nurse Practitioner

## 2024-06-26 VITALS — BP 132/78 | HR 82 | Temp 98.0°F | Resp 18 | Ht 64.0 in | Wt 170.3 lb

## 2024-06-26 DIAGNOSIS — E782 Mixed hyperlipidemia: Secondary | ICD-10-CM | POA: Diagnosis not present

## 2024-06-26 DIAGNOSIS — E663 Overweight: Secondary | ICD-10-CM

## 2024-06-26 DIAGNOSIS — R0683 Snoring: Secondary | ICD-10-CM

## 2024-06-26 MED ORDER — WEGOVY 0.25 MG/0.5ML ~~LOC~~ SOAJ: 0.2500 mg | SUBCUTANEOUS | 0 refills | Status: DC

## 2024-06-26 NOTE — Progress Notes (Signed)
 BP 132/78   Pulse 82   Temp 98 F (36.7 C)   Resp 18   Ht 5' 4 (1.626 m)   Wt 170 lb 4.8 oz (77.2 kg)   LMP 11/24/2020 (Exact Date)   SpO2 96%   BMI 29.23 kg/m    Subjective:    Patient ID: Sara Bentley, female    DOB: 1966-09-21, 58 y.o.   MRN: [id number]  HPI: Akela Pocius is a 58 y.o. female  Chief Complaint  Patient presents with   Snoring    Discussed the use of AI scribe software for clinical note transcription with the patient, who gave verbal consent to proceed.  History of Present Illness Sara Bentley is a 58 year old female who presents with increased snoring and poor sleep quality.  Snoring - Increased snoring, described as loud and 'like a man,' observed during a recent girls' weekend by friends - Unaware of snoring previously due to sleeping in separate rooms from her husband, who also snores - No observed episodes of apnea or stopped breathing during sleep  Sleep disturbance - Poor sleep quality with persistent fatigue upon waking - Difficulty initiating sleep and frequent nocturnal awakenings - Uncertain if snoring is contributing to sleep disturbances  Weight and metabolic concerns - Concern that weight may be contributing to snoring and sleep issues - Prior laboratory evaluation showed elevated triglycerides - Normal hemoglobin A1c, CBC, CMP, and thyroid  function tests - Engages in regular exercise and dietary modifications, specifically avoiding carbohydrates    Body mass index is 29.23 kg/m.  Filed Weights   06/26/24 0950  Weight: 170 lb 4.8 oz (77.2 kg)   Waist Measurement : 36 inches      06/26/2024   10:18 AM 06/26/2024    9:51 AM  Results of the Epworth flowsheet  Sitting and reading 0 0  Watching TV 0 0  Sitting, inactive in a public place (e.g. a theatre or a meeting) 0 0  As a passenger in a car for an hour without a break 0 0  Lying down to rest in the afternoon when circumstances permit 0 0  Sitting and  talking to someone 0 0  Sitting quietly after a lunch without alcohol 0 0  In a car, while stopped for a few minutes in traffic 0 0  Total score 0 0       06/26/2024    9:48 AM 12/22/2023    1:19 PM  Depression screen PHQ 2/9  Decreased Interest 0 0  Down, Depressed, Hopeless 0 0  PHQ - 2 Score 0 0  Altered sleeping  0  Tired, decreased energy  0  Change in appetite  0  Feeling bad or failure about yourself   0  Trouble concentrating  0  Moving slowly or fidgety/restless  0  Suicidal thoughts  0  PHQ-9 Score  0  Difficult doing work/chores  Not difficult at all    Relevant past medical, surgical, family and social history reviewed and updated as indicated. Interim medical history since our last visit reviewed. Allergies and medications reviewed and updated.  Review of Systems  Constitutional: Negative for fever or weight change.  Respiratory: Negative for cough and shortness of breath.   Cardiovascular: Negative for chest pain or palpitations.  Gastrointestinal: Negative for abdominal pain, no bowel changes.  Musculoskeletal: Negative for gait problem or joint swelling.  Skin: Negative for rash.  Neurological: Negative for dizziness or headache.  No other specific complaints  in a complete review of systems (except as listed in HPI above).      Objective:     BP 132/78   Pulse 82   Temp 98 F (36.7 C)   Resp 18   Ht 5' 4 (1.626 m)   Wt 170 lb 4.8 oz (77.2 kg)   LMP 11/24/2020 (Exact Date)   SpO2 96%   BMI 29.23 kg/m    Wt Readings from Last 3 Encounters:  06/26/24 170 lb 4.8 oz (77.2 kg)  02/28/24 170 lb 4.8 oz (77.2 kg)  12/22/23 171 lb 12.8 oz (77.9 kg)    Physical Exam Physical Exam GENERAL: Alert, cooperative, well developed, no acute distress HEENT: Normocephalic, normal oropharynx, moist mucous membranes CHEST: Clear to auscultation bilaterally, No wheezes, rhonchi, or crackles CARDIOVASCULAR: Normal heart rate and rhythm, S1 and S2 normal without  murmurs ABDOMEN: Soft, non-tender, non-distended, without organomegaly, Normal bowel sounds EXTREMITIES: No cyanosis or edema NEUROLOGICAL: Cranial nerves grossly intact, Moves all extremities without gross motor or sensory deficit   Results for orders placed or performed in visit on 02/28/24  Cytology - PAP   Collection Time: 02/28/24  1:18 PM  Result Value Ref Range   High risk HPV Negative    Adequacy      Satisfactory for evaluation; transformation zone component ABSENT.   Diagnosis (A)     - Atypical squamous cells of undetermined significance (ASC-US )   Comment Normal Reference Range HPV - Negative           Assessment & Plan:   Problem List Items Addressed This Visit   None Visit Diagnoses       Snoring    -  Primary   Relevant Medications   WEGOVY  0.25 MG/0.5ML SOAJ SQ injection   Other Relevant Orders   Ambulatory referral to Pulmonology     Mixed hyperlipidemia       Relevant Medications   WEGOVY  0.25 MG/0.5ML SOAJ SQ injection     Overweight       Relevant Medications   WEGOVY  0.25 MG/0.5ML SOAJ SQ injection        Assessment and Plan Assessment & Plan Snoring and suspected sleep apnea Reports increased snoring and morning fatigue, suggesting possible sleep apnea. Discussed risks of untreated sleep apnea, including cardiovascular events. Insurance may cover weight loss medication if sleep apnea is confirmed. - Refer to pulmonology for sleep study - Complete home sleep study to assess for sleep apnea - Consider CPAP if sleep study indicates moderate to severe sleep apnea  Obesity Concern about weight contributing to snoring and potential sleep apnea. Discussed weight loss medications, semaglutide  and tirzepatide, including mechanisms, side effects, and insurance coverage. Semaglutide  offers additional kidney and heart protection. Side effects include nausea, abdominal pain, constipation, diarrhea, and fatigue. Emphasized monitoring side effects and  adjusting injection sites for bioavailability. Insurance coverage will be attempted, with sleep apnea diagnosis as an alternative route if denied. - Attempt insurance approval for weight loss medication, either semaglutide  or tirzepatide - Educate on self-injection technique and side effect management - Monitor weight and report progress every three months - Measure waist circumference - Provide dietary guidance, aiming for 1600-1800 calories per day - Consider intermittent fasting as a weight loss strategy  Hypertriglyceridemia Elevated triglycerides on recent lipid panel. A1c, CBC, CMP, and thyroid  function tests were normal. Discussed importance of weight management in controlling triglyceride levels.        Follow up plan: Return in about 3 months (around 09/26/2024) for  follow up.

## 2024-06-26 NOTE — Patient Instructions (Signed)
 Healthy Weight Loss Guide ?? Weight Loss Goal - Aim for 1-2 pounds per week - Target: 5-10% of your starting body weight over 3-6 months ??? Nutrition Tips -aim for 1600-1800 calories a day - can try intermittent fasting  - Fill half your plate with vegetables, a quarter with protein, a quarter with whole grains - Choose lean proteins: chicken, fish, eggs, tofu, beans - Limit: - Sugary drinks (soda, sweet tea, juice) - Fried foods and fast food - Processed snacks (chips, candy, cookies) - Drink at least 64 oz of water per day - Practice portion control and mindful eating ???? Lifestyle Habits - Track what you eat (apps like MyFitnessPal, Lose It!, or a paper log) - Get 7-9 hours of sleep per night - Manage stress (meditation, breathing exercises, counseling if needed) - Limit alcohol (empty calories and may increase hunger) ???? Exercise Recommendations - Goal: 150 minutes per week of moderate activity (e.g., brisk walking, cycling) - Start with 10-15 minutes/day and build up gradually - Add 2 days per week of strength training (light weights, resistance bands, or bodyweight) ?? Remember: Progress > Perfection Small changes every day add up. Don't give up! - Avoid high-fat or greasy foods to reduce nausea - Focus on protein at each meal to preserve muscle mass - Stay well hydrated (at least 64 oz water per day) - Limit sugar and processed carbohydrates ?? Managing Side Effects if on weight loss medication - Eat slowly and stop eating when you feel full - Use anti-nausea strategies: ginger tea, peppermint, crackers - Talk to your provider about adjusting the dose if needed - Stool softeners or fiber supplements can help with constipation ?? Staying on Track - Track weight and non-scale victories (energy, clothing fit, labs) - Follow up with your provider regularly - Don't stop medication without medical guidance - Combine medication with healthy habits for best results ??  Remember Weight loss medications are a tool, not a shortcut. Healthy habits matter. Be patient and consistent--small changes lead to big results.

## 2024-07-02 ENCOUNTER — Ambulatory Visit: Admitting: Sleep Medicine

## 2024-07-02 ENCOUNTER — Encounter: Payer: Self-pay | Admitting: Sleep Medicine

## 2024-07-02 VITALS — BP 100/60 | HR 76 | Temp 99.2°F | Ht 64.0 in | Wt 171.6 lb

## 2024-07-02 DIAGNOSIS — G4733 Obstructive sleep apnea (adult) (pediatric): Secondary | ICD-10-CM | POA: Diagnosis not present

## 2024-07-02 NOTE — Progress Notes (Signed)
 Name:Sara Bentley MRN: [id number] DOB: 01/01/1966   CHIEF COMPLAINT:  EXCESSIVE DAYTIME SLEEPINESS   HISTORY OF PRESENT ILLNESS:  Sara Bentley is a 58 y.o. who present for c/o loud snoring and witnessed apnea which has been present for several years. Reports nocturnal awakenings due to unclear reasons and occasionally has difficulty falling back to sleep. Reports a 25-30 lb weight gain over the last several years. Denies morning headaches, RLS symptoms, dream enactment, cataplexy, hypnagogic or hypnapompic hallucinations. Denies a family history of sleep apnea. Denies drowsy driving. Drinks 1 cup of coffee daily, drinks 1-2 glasses of wine nightly, denies tobacco or illicit drug use.   Bedtime 10 pm Sleep onset 1-2 hours  Rise time 8  am   EPWORTH SLEEP SCORE 0    07/02/2024    3:00 PM 06/26/2024   10:18 AM 06/26/2024    9:51 AM  Results of the Epworth flowsheet  Sitting and reading 0 0 0  Watching TV 0 0 0  Sitting, inactive in a public place (e.g. a theatre or a meeting) 0 0 0  As a passenger in a car for an hour without a break 0 0 0  Lying down to rest in the afternoon when circumstances permit 0 0 0  Sitting and talking to someone 0 0 0  Sitting quietly after a lunch without alcohol 0 0 0  In a car, while stopped for a few minutes in traffic 0 0 0  Total score 0 0 0      PAST MEDICAL HISTORY :   has a past medical history of Anxiety, Barrett's esophagus (09/2012), BRCA gene mutation negative in female (05/2015), Esophageal reflux, Family history of breast cancer, GERD (gastroesophageal reflux disease), History of abnormal cervical Pap smear, History of mammogram (09/29/2014), and History of Papanicolaou smear of cervix (05/14/2015).  has a past surgical history that includes Cesarean section (2006); Cryotherapy; Colonoscopy (08/26/2021); and Esophagogastroduodenoscopy (08/2021). Prior to Admission medications   Medication Sig Start Date End Date Taking?  Authorizing Provider  omeprazole  (PRILOSEC) 20 MG capsule Take 20 mg by mouth daily.   Yes [provider]  WEGOVY  0.25 MG/0.5ML SOAJ SQ injection Inject 0.25 mg into the skin once a week. Use this dose for 1 month (4 shots) and then increase to next higher dose. 06/26/24  Yes Sara Mliss FALCON, FNP   No Known Allergies  FAMILY HISTORY:  family history includes Breast cancer (age of onset: 41) in her maternal grandmother; Cancer in her maternal grandmother; Diabetes in her brother and maternal uncle; Lung cancer in her father. SOCIAL HISTORY:  reports that she has quit smoking. Her smoking use included cigarettes. She has never used smokeless tobacco. She reports current alcohol use. She reports that she does not use drugs.   Review of Systems:  Gen:  Denies  fever, sweats, chills weight loss  HEENT: Denies blurred vision, double vision, ear pain, eye pain, hearing loss, nose bleeds, sore throat Cardiac:  No dizziness, chest pain or heaviness, chest tightness,edema, No JVD Resp:   No cough, -sputum production, -shortness of breath,-wheezing, -hemoptysis,  Gi: Denies swallowing difficulty, stomach pain, nausea or vomiting, diarrhea, constipation, bowel incontinence Gu:  Denies bladder incontinence, burning urine Ext:   Denies Joint pain, stiffness or swelling Skin: Denies  skin rash, easy bruising or bleeding or hives Endoc:  Denies polyuria, polydipsia , polyphagia or weight change Psych:   Denies depression, insomnia or hallucinations  Other:  All other systems  negative  VITAL SIGNS: BP 100/60 (BP Location: Right Arm, Patient Position: Sitting, Cuff Size: Large)   Pulse 76   Temp 99.2 F (37.3 C) (Oral)   Ht 5' 4 (1.626 m)   Wt 171 lb 9.6 oz (77.8 kg)   LMP 11/24/2020 (Exact Date)   SpO2 96%   BMI 29.46 kg/m    Physical Examination:   General Appearance: No distress  EYES PERRLA, EOM intact.   NECK Supple, No JVD Pulmonary: normal breath sounds, No wheezing.   CardiovascularNormal S1,S2.  No m/r/g.   Abdomen: Benign, Soft, non-tender. Skin:   warm, no rashes, no ecchymosis  Extremities: normal, no cyanosis, clubbing. Neuro:without focal findings,  speech normal  PSYCHIATRIC: Mood, affect within normal limits.   ASSESSMENT AND PLAN  OSA I suspect that OSA is likely present due to clinical presentation. Discussed the consequences of untreated sleep apnea. Advised not to drive drowsy for safety of patient and others. Will complete further evaluation with a home sleep study and follow up to review results.     MEDICATION ADJUSTMENTS/LABS AND TESTS ORDERED: Recommend Sleep Study   Patient  satisfied with Plan of action and management. All questions answered  Follow up to review HST results and treatment plan.   I spent a total of 31 minutes reviewing chart data, face-to-face evaluation with the patient, counseling and coordination of care as detailed above.    Sara Bentley, M.D.  Sleep Medicine [facility]

## 2024-07-02 NOTE — Patient Instructions (Signed)
 SABRA

## 2024-07-25 ENCOUNTER — Encounter: Payer: Self-pay | Admitting: Nurse Practitioner

## 2024-07-30 ENCOUNTER — Other Ambulatory Visit: Payer: Self-pay | Admitting: Nurse Practitioner

## 2024-07-30 ENCOUNTER — Telehealth: Payer: Self-pay | Admitting: Pharmacy Technician

## 2024-07-30 DIAGNOSIS — E663 Overweight: Secondary | ICD-10-CM

## 2024-07-30 MED ORDER — ZEPBOUND 2.5 MG/0.5ML ~~LOC~~ SOAJ: 2.5000 mg | SUBCUTANEOUS | 0 refills | Status: DC

## 2024-07-30 NOTE — Telephone Encounter (Signed)
 Prior Authorization form/request asks a question that requires your assistance. Please see the question below and advise accordingly. The PA will not be submitted until the necessary information is received.   In order for PA for zepbound  to be submitted for OSA we need the results of patient's sleep study showing AHI of 15 or higher

## 2024-07-30 NOTE — Telephone Encounter (Signed)
 Good afternoon Sara Bentley we need the results of sleep study to send to her plan, could not find any results in her chart

## 2024-07-30 NOTE — Telephone Encounter (Signed)
 Pt just saw pulmonology I don't think she has a official diagnosis yet.  Sleep test was ordered

## 2024-08-01 ENCOUNTER — Encounter

## 2024-08-01 DIAGNOSIS — G4733 Obstructive sleep apnea (adult) (pediatric): Secondary | ICD-10-CM

## 2024-08-05 ENCOUNTER — Other Ambulatory Visit (HOSPITAL_COMMUNITY): Payer: Self-pay

## 2024-08-12 ENCOUNTER — Ambulatory Visit: Admitting: Sleep Medicine

## 2024-08-12 DIAGNOSIS — G4733 Obstructive sleep apnea (adult) (pediatric): Secondary | ICD-10-CM | POA: Diagnosis not present

## 2024-08-13 ENCOUNTER — Ambulatory Visit: Payer: Self-pay

## 2024-08-13 NOTE — Telephone Encounter (Signed)
 Per Dr. Jess- insurance will not approve Zepbound  for mild sleep apnea, only for moderate to severe.

## 2024-08-21 ENCOUNTER — Ambulatory Visit (INDEPENDENT_AMBULATORY_CARE_PROVIDER_SITE_OTHER): Admitting: Sleep Medicine

## 2024-08-21 VITALS — BP 120/80 | HR 64 | Temp 98.3°F | Ht 64.0 in | Wt 171.6 lb

## 2024-08-21 DIAGNOSIS — Z87891 Personal history of nicotine dependence: Secondary | ICD-10-CM

## 2024-08-21 DIAGNOSIS — Z6829 Body mass index (BMI) 29.0-29.9, adult: Secondary | ICD-10-CM

## 2024-08-21 DIAGNOSIS — G4733 Obstructive sleep apnea (adult) (pediatric): Secondary | ICD-10-CM

## 2024-08-21 NOTE — Progress Notes (Unsigned)
 Name:Sara Bentley MRN: [id number] DOB: 09/01/1966   CHIEF COMPLAINT:  HST F/U   HISTORY OF PRESENT ILLNESS:  Sara Bentley is a 58 y.o. who presents to follow up on HST results. The patient underwent HST which revealed mild OSA (AHI 6, O2 nadir 87%).     EPWORTH SLEEP SCORE 0    07/02/2024    3:00 PM 06/26/2024   10:18 AM 06/26/2024    9:51 AM  Results of the Epworth flowsheet  Sitting and reading 0 0 0  Watching TV 0 0 0  Sitting, inactive in a public place (e.g. a theatre or a meeting) 0 0 0  As a passenger in a car for an hour without a break 0 0 0  Lying down to rest in the afternoon when circumstances permit 0 0 0  Sitting and talking to someone 0 0 0  Sitting quietly after a lunch without alcohol 0 0 0  In a car, while stopped for a few minutes in traffic 0 0 0  Total score 0 0 0    PAST MEDICAL HISTORY :   has a past medical history of Anxiety, Barrett's esophagus (09/2012), BRCA gene mutation negative in female (05/2015), Esophageal reflux, Family history of breast cancer, GERD (gastroesophageal reflux disease), History of abnormal cervical Pap smear, History of mammogram (09/29/2014), and History of Papanicolaou smear of cervix (05/14/2015).  has a past surgical history that includes Cesarean section (2006); Cryotherapy; Colonoscopy (08/26/2021); and Esophagogastroduodenoscopy (08/2021). Prior to Admission medications   Medication Sig Start Date End Date Taking? Authorizing Provider  omeprazole  (PRILOSEC) 20 MG capsule Take 20 mg by mouth daily.   Yes [provider]  WEGOVY  0.25 MG/0.5ML SOAJ SQ injection Inject 0.25 mg into the skin once a week. Use this dose for 1 month (4 shots) and then increase to next higher dose. 06/26/24  Yes Gareth Mliss FALCON, FNP   No Known Allergies  FAMILY HISTORY:  family history includes Breast cancer (age of onset: 68) in her maternal grandmother; Cancer in her maternal grandmother; Diabetes in her brother and maternal  uncle; Lung cancer in her father. SOCIAL HISTORY:  reports that she has quit smoking. Her smoking use included cigarettes. She has never used smokeless tobacco. She reports current alcohol use. She reports that she does not use drugs.   Review of Systems:  Gen:  Denies  fever, sweats, chills weight loss  HEENT: Denies blurred vision, double vision, ear pain, eye pain, hearing loss, nose bleeds, sore throat Cardiac:  No dizziness, chest pain or heaviness, chest tightness,edema, No JVD Resp:   No cough, -sputum production, -shortness of breath,-wheezing, -hemoptysis,  Gi: Denies swallowing difficulty, stomach pain, nausea or vomiting, diarrhea, constipation, bowel incontinence Gu:  Denies bladder incontinence, burning urine Ext:   Denies Joint pain, stiffness or swelling Skin: Denies  skin rash, easy bruising or bleeding or hives Endoc:  Denies polyuria, polydipsia , polyphagia or weight change Psych:   Denies depression, insomnia or hallucinations  Other:  All other systems negative  VITAL SIGNS: BP 120/80   Pulse 64   Temp 98.3 F (36.8 C)   Ht 5' 4 (1.626 m)   Wt 171 lb 9.6 oz (77.8 kg)   LMP 11/24/2020 (Exact Date)   SpO2 97%   BMI 29.46 kg/m    Physical Examination:   General Appearance: No distress  EYES PERRLA, EOM intact.   NECK Supple, No JVD Pulmonary: normal breath sounds, No wheezing.  CardiovascularNormal S1,S2.  No m/r/g.   Abdomen: Benign, Soft, non-tender. Skin:   warm, no rashes, no ecchymosis  Extremities: normal, no cyanosis, clubbing. Neuro:without focal findings,  speech normal  PSYCHIATRIC: Mood, affect within normal limits.   ASSESSMENT AND PLAN  OSA Reviewed HST results with patient. Patient would like to try weight loss at this time. Discussed the consequences of untreated sleep apnea. Advised not to drive drowsy for safety of patient and others. Will follow up in 3 months.   BMI 29 Counseled patient on diet and lifestyle modification. Will   Denies family or personal history of thyroid  medullary CA or pancreatitis.    Patient  satisfied with Plan of action and management. All questions answered  I spent a total of 31 minutes reviewing chart data, face-to-face evaluation with the patient, counseling and coordination of care as detailed above.    Caresse Sedivy, M.D.  Sleep Medicine [facility]

## 2024-08-26 MED ORDER — ZEPBOUND 2.5 MG/0.5ML ~~LOC~~ SOLN: 2.5000 mg | SUBCUTANEOUS | 2 refills | Status: DC

## 2024-08-27 ENCOUNTER — Ambulatory Visit: Payer: Self-pay

## 2024-08-27 NOTE — Telephone Encounter (Signed)
 Appointment made at [facility] 08/28/2024 at 10:40am with Dr Isaiah Pepper No availability in PCP office  FYI Only or Action Required?: FYI only for provider.  Patient was last seen in primary care on 06/26/2024 by Gareth Mliss FALCON, FNP.  Called Nurse Triage reporting Cough.  Symptoms began 3 weeks ago.  Interventions attempted: OTC medications: tylenol, ibuprofen, cough drops, Rest, hydration, or home remedies, and Other: steam from shower.  Symptoms are: unchanged.  Triage Disposition: See Physician Within 24 Hours  Patient/caregiver understands and will follow disposition?: Yes            Copied from CRM #[id number]. Topic: Clinical - Red Word Triage >> Aug 27, 2024  1:45 PM Tiffini S wrote: [facility] that prompted transfer to Nurse Triage: Patient called about  Wet cough, sinus/ headache with congestion, pressure. Provide next available appointment is 09/03/24. Reason for Disposition  [1] Continuous (nonstop) coughing interferes with work or school AND [2] no improvement using cough treatment per Care Advice  Answer Assessment - Initial Assessment Questions Cough x 3 weeks Going on vacation next week Patient has been using cough drops, Tylenol or Ibuprofen for sinus pressure in head/face, steam in bathroom Denies chest pain or difficulty breathing Patient is advised that if anything worsens to go to the Emergency Room. Patient verbalized understanding.   1. ONSET: When did the cough begin?      3 weeks ago 2. SEVERITY: How bad is the cough today?      ----- 3. SPUTUM: Describe the color of your sputum (e.g., none, dry cough; clear, white, yellow, green)     Grey/creamish 4. HEMOPTYSIS: Are you coughing up any blood? If Yes, ask: How much? (e.g., flecks, streaks, tablespoons, etc.)     no 5. DIFFICULTY BREATHING: Are you having difficulty breathing? If Yes, ask: How bad is it? (e.g., mild, moderate, severe)      no 6. FEVER: Do you have  a fever? If Yes, ask: What is your temperature, how was it measured, and when did it start?     no 7. CARDIAC HISTORY: Do you have any history of heart disease? (e.g., heart attack, congestive heart failure)      no 8. LUNG HISTORY: Do you have any history of lung disease?  (e.g., pulmonary embolus, asthma, emphysema)     no 9. PE RISK FACTORS: Do you have a history of blood clots? (or: recent major surgery, recent prolonged travel, bedridden)     no 10. OTHER SYMPTOMS: Do you have any other symptoms? (e.g., runny nose, wheezing, chest pain)       Sinus drainage, head/sinus pressure  Protocols used: Cough - Acute Productive-A-AH

## 2024-08-28 ENCOUNTER — Ambulatory Visit (INDEPENDENT_AMBULATORY_CARE_PROVIDER_SITE_OTHER)

## 2024-08-28 VITALS — BP 133/86 | HR 66 | Temp 99.0°F | Resp 16 | Ht 64.0 in | Wt 173.0 lb

## 2024-08-28 DIAGNOSIS — J019 Acute sinusitis, unspecified: Secondary | ICD-10-CM | POA: Diagnosis not present

## 2024-08-28 MED ORDER — AMOXICILLIN-POT CLAVULANATE 875-125 MG PO TABS: 1.0000 | ORAL_TABLET | Freq: Two times a day (BID) | ORAL | 0 refills | Status: AC

## 2024-08-28 NOTE — Progress Notes (Signed)
     Acute visit  Patient: Sara Bentley   DOB: 1966/09/26   58 y.o. Female  MRN: [id number] PCP: Gareth Mliss FALCON, FNP   Chief Complaint  Patient presents with   Acute Visit    Cough x3 wks  head/sinus pressure & congestion   Subjective     HPI:   - Cough has been going on for ~ 3 weeks - Now having sinus pressure, headache, and worsening congestion - Denies fevers, chills, N/V/D, sore throat  Review of systems as noted in HPI.   Objective    BP 133/86 (BP Location: Right Arm, Patient Position: Sitting, Cuff Size: Normal)   Pulse 66   Temp 99 F (37.2 C) (Oral)   Resp 16   Ht 5' 4 (1.626 m)   Wt 173 lb (78.5 kg)   LMP 11/24/2020 (Exact Date)   SpO2 97%   BMI 29.70 kg/m  Physical Exam Constitutional:      Appearance: Normal appearance.  HENT:     Head: Normocephalic and atraumatic.     Right Ear: Tympanic membrane, ear canal and external ear normal.     Left Ear: Tympanic membrane, ear canal and external ear normal.     Nose: Congestion present.     Right Sinus: Maxillary sinus tenderness and frontal sinus tenderness present.     Left Sinus: Maxillary sinus tenderness and frontal sinus tenderness present.     Mouth/Throat:     Mouth: Mucous membranes are moist.     Pharynx: Posterior oropharyngeal erythema present. No oropharyngeal exudate.  Eyes:     Pupils: Pupils are equal, round, and reactive to light.  Cardiovascular:     Rate and Rhythm: Normal rate and regular rhythm.     Heart sounds: Normal heart sounds.  Pulmonary:     Effort: Pulmonary effort is normal.     Breath sounds: Normal breath sounds.  Skin:    General: Skin is warm.  Neurological:     General: No focal deficit present.     Mental Status: She is alert.       No results found for any visits on 08/28/24.  Assessment & Plan     Problem List Items Addressed This Visit       Respiratory   Acute non-recurrent sinusitis - Primary   Patient with 3 week hx of cough now with  several days of sinus pressure, headache, and worsening congestion. Lungs clear. Most consistent with acute sinusitis.  - Will treat with Augmentin BID x 7days. Side effects discussed. - Encouraged continued hydration, Mucinex as needed - Return precautions given      Relevant Medications   amoxicillin-clavulanate (AUGMENTIN) 875-125 MG tablet     Meds ordered this encounter  Medications   amoxicillin-clavulanate (AUGMENTIN) 875-125 MG tablet    Sig: Take 1 tablet by mouth 2 (two) times daily for 7 days.    Dispense:  14 tablet    Refill:  0     No follow-ups on file.      Isaiah DELENA Pepper, MD  [facility] [phone] (phone) [phone] (fax)

## 2024-08-28 NOTE — Assessment & Plan Note (Addendum)
 Patient with 3 week hx of cough now with several days of sinus pressure, headache, and worsening congestion. Lungs clear. Most consistent with acute sinusitis.  - Will treat with Augmentin BID x 7days. Side effects discussed. - Encouraged continued hydration, Mucinex as needed - Return precautions given

## 2024-09-26 ENCOUNTER — Ambulatory Visit: Admitting: Nurse Practitioner

## 2024-10-04 ENCOUNTER — Telehealth: Payer: Self-pay

## 2024-10-04 DIAGNOSIS — Z6829 Body mass index (BMI) 29.0-29.9, adult: Secondary | ICD-10-CM

## 2024-10-04 DIAGNOSIS — G4733 Obstructive sleep apnea (adult) (pediatric): Secondary | ICD-10-CM

## 2024-10-04 NOTE — Telephone Encounter (Signed)
 Copied from CRM #[id number]. Topic: Clinical - Prescription Issue >> Oct 04, 2024  9:50 AM Devaughn RAMAN wrote: Reason for CRM: Pt called regarding her ZEPBOUND  2.5 MG/0.5ML injection vial. Pt stated she was supposed to have her dosage increased but it is not pt stated the first dosage was 2.5 but the 2nd dosage is supposed to be 5, but the rx is still written for 2.5. Pharmacy is needing a new rx for the increased dosage.

## 2024-10-09 MED ORDER — ZEPBOUND 5 MG/0.5ML ~~LOC~~ SOAJ: 5.0000 mg | SUBCUTANEOUS | 1 refills | Status: DC

## 2024-10-09 NOTE — Telephone Encounter (Signed)
Refill has been sent in and I have notified the patient.  Nothing further needed.

## 2024-10-10 MED ORDER — ZEPBOUND 5 MG/0.5ML ~~LOC~~ SOLN: 5.0000 mg | SUBCUTANEOUS | 5 refills | Status: DC

## 2024-10-10 MED ORDER — ZEPBOUND 5 MG/0.5ML ~~LOC~~ SOLN: 5.0000 mg | SUBCUTANEOUS | 2 refills | Status: DC

## 2024-10-10 NOTE — Telephone Encounter (Signed)
 Received two faxes from [facility] to correct the orders sent in for the refill of pt's Zepbound . Order has been corrected. NFN.

## 2024-10-10 NOTE — Addendum Note (Signed)
 Addended by: Babbette Dalesandro J on: 10/10/2024 11:24 AM   Modules accepted: Orders

## 2024-10-10 NOTE — Addendum Note (Signed)
 Addended by: Shateria Paternostro J on: 10/10/2024 09:17 AM   Modules accepted: Orders

## 2024-10-31 MED ORDER — ZEPBOUND 7.5 MG/0.5ML ~~LOC~~ SOAJ: 7.5000 mg | SUBCUTANEOUS | 0 refills | Status: DC

## 2024-10-31 NOTE — Addendum Note (Signed)
 Addended by: Margeret Stachnik J on: 10/31/2024 04:25 PM   Modules accepted: Orders

## 2024-11-01 MED ORDER — ZEPBOUND 7.5 MG/0.5ML ~~LOC~~ SOAJ: 7.5000 mg | SUBCUTANEOUS | 0 refills | Status: AC

## 2024-11-01 NOTE — Addendum Note (Signed)
 Addended by: Clance Baquero J on: 11/01/2024 11:47 AM   Modules accepted: Orders

## 2024-12-03 MED ORDER — ZEPBOUND 10 MG/0.5ML ~~LOC~~ SOLN: 10.0000 mg | SUBCUTANEOUS | 0 refills | Status: AC

## 2024-12-03 NOTE — Addendum Note (Signed)
 Addended by: Rasheem Figiel J on: 12/03/2024 01:09 PM   Modules accepted: Orders

## 2024-12-25 ENCOUNTER — Ambulatory Visit (INDEPENDENT_AMBULATORY_CARE_PROVIDER_SITE_OTHER): Admitting: Nurse Practitioner

## 2024-12-25 ENCOUNTER — Encounter: Payer: Self-pay | Admitting: Nurse Practitioner

## 2024-12-25 VITALS — BP 116/88 | HR 78 | Temp 97.8°F | Ht 64.0 in | Wt 154.0 lb

## 2024-12-25 DIAGNOSIS — Z13220 Encounter for screening for lipoid disorders: Secondary | ICD-10-CM

## 2024-12-25 DIAGNOSIS — N951 Menopausal and female climacteric states: Secondary | ICD-10-CM | POA: Diagnosis not present

## 2024-12-25 DIAGNOSIS — Z0000 Encounter for general adult medical examination without abnormal findings: Secondary | ICD-10-CM

## 2024-12-25 DIAGNOSIS — Z130 Encounter for screening for diseases of the blood and blood-forming organs and certain disorders involving the immune mechanism: Secondary | ICD-10-CM | POA: Diagnosis not present

## 2024-12-25 DIAGNOSIS — Z124 Encounter for screening for malignant neoplasm of cervix: Secondary | ICD-10-CM

## 2024-12-25 DIAGNOSIS — Z131 Encounter for screening for diabetes mellitus: Secondary | ICD-10-CM | POA: Diagnosis not present

## 2024-12-25 DIAGNOSIS — R87610 Atypical squamous cells of undetermined significance on cytologic smear of cervix (ASC-US): Secondary | ICD-10-CM

## 2024-12-25 DIAGNOSIS — Z1231 Encounter for screening mammogram for malignant neoplasm of breast: Secondary | ICD-10-CM

## 2024-12-25 MED ORDER — ESTROGENS CONJUGATED 0.625 MG/GM VA CREA: 1.0000 | TOPICAL_CREAM | Freq: Every day | VAGINAL | 3 refills | Status: AC

## 2024-12-25 NOTE — Progress Notes (Signed)
 Name: Sara Bentley   MRN: [id number]    DOB: 1966/06/14   Date:12/25/2024       Progress Note  Subjective  Chief Complaint  Chief Complaint  Patient presents with   Annual Exam    Pt would like to discuss menopausal symptoms.     HPI  Patient presents for annual CPE. Discussed the use of AI scribe software for clinical note transcription with the patient, who gave verbal consent to proceed.  History of Present Illness Sara Bentley is a 59 year old female who presents for an annual physical exam.  Preventive health maintenance - Up to date on screenings for Hepatitis C and HIV - Last mammogram on Mar 28, 2024 - Colorectal cancer screening current with colonoscopy on August 26, 2021 - Last EKG on June 30, 2022 - Pap smear prior to September 2025 showed ASCUS with negative HPV test - Last dental exam six months ago - Last eye exam in May  Menopausal symptoms and genitourinary syndrome - Postmenopausal - Decreased sexual drive - Vaginal dryness - Dyspareunia with associated bleeding during intercourse - No prior use of estrogen cream - No hot flashes - Occasional stress incontinence, especially with sneezing or coughing  Weight management - Currently taking Zepbound  for weight management, titrated to 10 mg - Sister uses a non-name brand version of the medication that is $200 less expensive  Lifestyle and sleep - Maintains a well-balanced diet - Exercises daily - Inconsistent sleep, averaging six hours per night    Diet: well balanced diet Exercise: daily  Sleep: 6 hours Last dental exam:6 months ago Last eye exam: may  Flowsheet Row Office Visit from 12/25/2024 in [facility]  AUDIT-C Score 4   Depression: Phq 9 is  negative    12/25/2024    1:13 PM 08/28/2024   10:34 AM 06/26/2024    9:48 AM 12/22/2023    1:19 PM  Depression screen PHQ 2/9  Decreased Interest 0 0 0 0  Down, Depressed, Hopeless 0 0 0 0  PHQ - 2 Score 0 0 0 0   Altered sleeping  0  0  Tired, decreased energy  0  0  Change in appetite  0  0  Feeling bad or failure about yourself   0  0  Trouble concentrating  0  0  Moving slowly or fidgety/restless  0  0  Suicidal thoughts  0  0  PHQ-9 Score  0   0   Difficult doing work/chores    Not difficult at all     Data saved with a previous flowsheet row definition   Hypertension: BP Readings from Last 3 Encounters:  12/25/24 116/88  08/28/24 133/86  08/21/24 120/80   Obesity: Wt Readings from Last 3 Encounters:  12/25/24 154 lb (69.9 kg)  08/28/24 173 lb (78.5 kg)  08/21/24 171 lb 9.6 oz (77.8 kg)   BMI Readings from Last 3 Encounters:  12/25/24 26.43 kg/m  08/28/24 29.70 kg/m  08/21/24 29.46 kg/m     Vaccines:  HPV: up to at age 58 , ask insurance if age between 66-45  Shingrix: 64-64 yo and ask insurance if covered when patient above 53 yo Pneumonia:  educated and discussed with patient. Flu:  educated and discussed with patient.  Hep C Screening: completed STD testing and prevention (HIV/chl/gon/syphilis): completed Intimate partner violence:none Sexual History :married Menstrual History/LMP/Abnormal Bleeding: postmenopausal  Incontinence Symptoms: stress incontinence  Breast cancer:  - Last Mammogram: 03/28/2024 -  BRCA gene screening: ordered  Osteoporosis: Discussed high calcium and vitamin D supplementation, weight bearing exercises  Cervical cancer screening: 02/28/2024, ASCUS, repeat today  Skin cancer: Discussed monitoring for atypical lesions  Colorectal cancer: 08/26/2021   Lung cancer:   Low Dose CT Chest recommended if Age 31-80 years, 20 pack-year currently smoking OR have quit w/in 15years. Patient does not qualify.   ECG: 06/30/2022  Advanced Care Planning: A voluntary discussion about advance care planning including the explanation and discussion of advance directives.  Discussed health care proxy and Living will, and the patient was able to identify a health  care proxy as husband.  Patient does not have a living will at present time. If patient does have living will, I have requested they bring this to the clinic to be scanned in to their chart.  Lipids: Lab Results  Component Value Date   CHOL 160 12/25/2023   Lab Results  Component Value Date   HDL 55 12/25/2023   Lab Results  Component Value Date   LDLCALC 77 12/25/2023   Lab Results  Component Value Date   TRIG 162 (H) 12/25/2023   Lab Results  Component Value Date   CHOLHDL 2.9 12/25/2023   No results found for: LDLDIRECT  Glucose: Glucose  Date Value Ref Range Status  12/25/2023 83 70 - 99 mg/dL Final   Glucose, Bld  Date Value Ref Range Status  06/29/2022 90 70 - 99 mg/dL Final    Comment:    Glucose reference range applies only to samples taken after fasting for at least 8 hours.  12/17/2017 99 65 - 99 mg/dL Final    Patient Active Problem List   Diagnosis Date Noted   Acute non-recurrent sinusitis 08/28/2024   Family history of breast cancer    GERD (gastroesophageal reflux disease) 08/22/2019   Anxiety and depression 08/22/2019   Endocervical polyp 08/22/2019   Cysts of both ovaries 08/22/2019   Other insomnia 08/20/2018   Anxiety     Past Surgical History:  Procedure Laterality Date   CESAREAN SECTION  2006   COLONOSCOPY  08/26/2021   Dr. Maryruth; neg; repeat in 10 yrs   CRYOTHERAPY     AGE 21s   ESOPHAGOGASTRODUODENOSCOPY  08/2021   Dr. Maryruth; neg    Family History  Problem Relation Age of Onset   Lung cancer Father    Diabetes Brother    Diabetes Maternal Uncle    Breast cancer Maternal Grandmother 35   Cancer Maternal Grandmother     Social History   Socioeconomic History   Marital status: Married    Spouse name: Not on file   Number of children: Not on file   Years of education: 16   Highest education level: Bachelor's degree (e.g., BA, AB, BS)  Occupational History   Occupation: homemaker  Tobacco Use   Smoking status:  Former    Current packs/day: 1.00    Types: Cigarettes   Smokeless tobacco: Never   Tobacco comments:    2004  Vaping Use   Vaping status: Never Used  Substance and Sexual Activity   Alcohol use: Yes   Drug use: No   Sexual activity: Yes    Birth control/protection: Post-menopausal  Other Topics Concern   Not on file  Social History Narrative   Not on file   Social Drivers of Health   Tobacco Use: Medium Risk (12/25/2024)   Patient History    Smoking Tobacco Use: Former    Smokeless Tobacco Use:  Never    Passive Exposure: Not on file  Financial Resource Strain: Low Risk (12/25/2024)   Overall Financial Resource Strain (CARDIA)    Difficulty of Paying Living Expenses: Not hard at all  Food Insecurity: No Food Insecurity (12/25/2024)   Epic    Worried About Programme Researcher, Broadcasting/film/video in the Last Year: Never true    Ran Out of Food in the Last Year: Never true  Transportation Needs: No Transportation Needs (12/25/2024)   Epic    Lack of Transportation (Medical): No    Lack of Transportation (Non-Medical): No  Physical Activity: Sufficiently Active (12/25/2024)   Exercise Vital Sign    Days of Exercise per Week: 7 days    Minutes of Exercise per Session: 40 min  Stress: No Stress Concern Present (12/25/2024)   [employer] of Occupational Health - Occupational Stress Questionnaire    Feeling of Stress: Not at all  Social Connections: Socially Integrated (12/25/2024)   Social Connection and Isolation Panel    Frequency of Communication with Friends and Family: Three times a week    Frequency of Social Gatherings with Friends and Family: Three times a week    Attends Religious Services: More than 4 times per year    Active Member of Clubs or Organizations: Yes    Attends Banker Meetings: 1 to 4 times per year    Marital Status: Married  Catering Manager Violence: Not At Risk (12/25/2024)   Epic    Fear of Current or Ex-Partner: No    Emotionally Abused: No    Physically  Abused: No    Sexually Abused: No  Depression (PHQ2-9): Low Risk (12/25/2024)   Depression (PHQ2-9)    PHQ-2 Score: 0  Alcohol Screen: Low Risk (12/25/2024)   Alcohol Screen    Last Alcohol Screening Score (AUDIT): 4  Housing: Unknown (12/25/2024)   Epic    Unable to Pay for Housing in the Last Year: No    Number of Times Moved in the Last Year: Not on file    Homeless in the Last Year: No  Utilities: Not At Risk (12/25/2024)   Epic    Threatened with loss of utilities: No  Health Literacy: Adequate Health Literacy (12/25/2024)   B1300 Health Literacy    Frequency of need for help with medical instructions: Never    Current Medications[1]  Allergies[2]   ROS  Constitutional: Negative for fever or weight change.  Respiratory: Negative for cough and shortness of breath.   Cardiovascular: Negative for chest pain or palpitations.  Gastrointestinal: Negative for abdominal pain, no bowel changes.  Musculoskeletal: Negative for gait problem or joint swelling.  Skin: Negative for rash.  Neurological: Negative for dizziness or headache.  No other specific complaints in a complete review of systems (except as listed in HPI above).   Objective  Vitals:   12/25/24 1303  BP: 116/88  Pulse: 78  Temp: 97.8 F (36.6 C)  SpO2: 97%  Weight: 154 lb (69.9 kg)  Height: 5' 4 (1.626 m)    Body mass index is 26.43 kg/m.  Physical Exam Vitals reviewed. Exam conducted with a chaperone present.  Constitutional:      Appearance: Normal appearance.  HENT:     Head: Normocephalic.     Right Ear: Tympanic membrane normal.     Left Ear: Tympanic membrane normal.     Nose: Nose normal.  Eyes:     Extraocular Movements: Extraocular movements intact.     Conjunctiva/sclera: Conjunctivae normal.  Pupils: Pupils are equal, round, and reactive to light.  Neck:     Thyroid : No thyroid  mass, thyromegaly or thyroid  tenderness.  Cardiovascular:     Rate and Rhythm: Normal rate and regular  rhythm.     Pulses: Normal pulses.     Heart sounds: Normal heart sounds.  Pulmonary:     Effort: Pulmonary effort is normal.     Breath sounds: Normal breath sounds.  Abdominal:     General: Bowel sounds are normal.     Palpations: Abdomen is soft.  Genitourinary:    Vagina: Normal.     Cervix: Normal.     Uterus: Normal.      Adnexa: Right adnexa normal and left adnexa normal.  Musculoskeletal:        General: Normal range of motion.     Cervical back: Normal range of motion and neck supple.     Right lower leg: No edema.     Left lower leg: No edema.  Skin:    General: Skin is warm and dry.     Capillary Refill: Capillary refill takes less than 2 seconds.  Neurological:     General: No focal deficit present.     Mental Status: She is alert and oriented to person, place, and time. Mental status is at baseline.  Psychiatric:        Mood and Affect: Mood normal.        Behavior: Behavior normal.        Thought Content: Thought content normal.        Judgment: Judgment normal.      Fall Risk:    12/25/2024    1:04 PM 06/26/2024    9:48 AM 02/28/2024    1:05 PM 12/22/2023    1:18 PM  Fall Risk   Falls in the past year? 0 0 0 0  Number falls in past yr: 0 0 0 0  Injury with Fall? 0 0  0  0   Risk for fall due to : No Fall Risks No Fall Risks    Follow up Falls evaluation completed Falls evaluation completed Falls evaluation completed      Data saved with a previous flowsheet row definition     Functional Status Survey: Is the patient deaf or have difficulty hearing?: No Does the patient have difficulty seeing, even when wearing glasses/contacts?: No Does the patient have difficulty concentrating, remembering, or making decisions?: No Does the patient have difficulty walking or climbing stairs?: No Does the patient have difficulty dressing or bathing?: No Does the patient have difficulty doing errands alone such as visiting a doctor's office or shopping?:  No   Assessment & Plan  Problem List Items Addressed This Visit   None Visit Diagnoses       Annual physical exam    -  Primary     Screening for cervical cancer         Atypical squamous cells of undetermined significance on cytologic smear of cervix (ASC-US )         Screening for deficiency anemia         Screening for cholesterol level         Screening for diabetes mellitus          Assessment and Plan Assessment & Plan Atypical squamous cells of undetermined significance (ASC-US ) Previous Pap smear showed ASC-US  with negative HPV, indicating no immediate cancer risk. Repeat Pap smear is necessary to monitor for persistent abnormalities. - Will repeat  Pap smear at her convenience - If repeat Pap smear is abnormal, will refer to GYN for biopsy  Postmenopausal atrophic vaginitis Experiencing vaginal dryness and decreased libido, likely due to postmenopausal atrophic vaginitis. Estrogen cream is recommended to alleviate symptoms and prevent recurrent UTIs. - Prescribed estrogen cream to be applied three times a week at first then can increase to nightly - Advised use of plain KY jelly for lubrication if needed - Sent prescription to pharmacy  Obesity Currently on Wegovy  10 mg for weight management. Discussed potential cost-saving options with generic alternatives, but advised caution due to lack of FDA approval and monitoring. Consideration of compounding pharmacy options for cost-effective alternatives. - Continue Wegovy  10 mg - Discussed potential switch to generic alternatives with caution - Consider compounding pharmacy options for cost-effective alternatives - Monitor weight and health parameters regularly  General Health Maintenance Up to date on mammogram and colorectal cancer screening. Discussed the importance of regular screenings and maintaining a healthy lifestyle. - Recommended annual mammogram - Continue regular colorectal cancer screening - Encouraged balanced  diet and regular exercise    -USPSTF grade A and B recommendations reviewed with patient; age-appropriate recommendations, preventive care, screening tests, etc discussed and encouraged; healthy living encouraged; see AVS for patient education given to patient -Discussed importance of 150 minutes of physical activity weekly, eat two servings of fish weekly, eat one serving of tree nuts ( cashews, pistachios, pecans, almonds.SABRA) every other day, eat 6 servings of fruit/vegetables daily and drink plenty of water and avoid sweet beverages.   -Reviewed Health Maintenance: yes     [1]  Current Outpatient Medications:    Fluorouracil-Calcipotriene 5-0.005 % CREA, Apply a thin layer to face twice daily for 5 days time, in the fall or winter. Avoid area around the eyes., Disp: , Rfl:    omeprazole  (PRILOSEC) 20 MG capsule, Take 20 mg by mouth daily., Disp: , Rfl:    tirzepatide  (ZEPBOUND ) 10 MG/0.5ML injection vial, Inject 10 mg into the skin once a week., Disp: 6 mL, Rfl: 0   tirzepatide  (ZEPBOUND ) 7.5 MG/0.5ML Pen, Inject 7.5 mg into the skin once a week. (Patient not taking: Reported on 12/25/2024), Disp: 6 mL, Rfl: 0 [2] No Known Allergies

## 2024-12-27 ENCOUNTER — Ambulatory Visit: Payer: Self-pay | Admitting: Nurse Practitioner

## 2024-12-27 LAB — CBC WITH DIFFERENTIAL/PLATELET
Basophils Absolute: 0 10*3/uL (ref 0.0–0.2)
Basos: 1 %
EOS (ABSOLUTE): 0 10*3/uL (ref 0.0–0.4)
Eos: 1 %
Hematocrit: 45.2 % (ref 34.0–46.6)
Hemoglobin: 15.1 g/dL (ref 11.1–15.9)
Immature Grans (Abs): 0 10*3/uL (ref 0.0–0.1)
Immature Granulocytes: 0 %
Lymphocytes Absolute: 1.7 10*3/uL (ref 0.7–3.1)
Lymphs: 42 %
MCH: 34.4 pg — ABNORMAL HIGH (ref 26.6–33.0)
MCHC: 33.4 g/dL (ref 31.5–35.7)
MCV: 103 fL — ABNORMAL HIGH (ref 79–97)
Monocytes Absolute: 0.3 10*3/uL (ref 0.1–0.9)
Monocytes: 8 %
Neutrophils Absolute: 2 10*3/uL (ref 1.4–7.0)
Neutrophils: 48 %
Platelets: 182 10*3/uL (ref 150–450)
RBC: 4.39 x10E6/uL (ref 3.77–5.28)
RDW: 12.1 % (ref 11.7–15.4)
WBC: 4.1 10*3/uL (ref 3.4–10.8)

## 2024-12-27 LAB — COMPREHENSIVE METABOLIC PANEL WITH GFR
ALT: 29 [IU]/L (ref 0–32)
AST: 22 [IU]/L (ref 0–40)
Albumin: 4.5 g/dL (ref 3.8–4.9)
Alkaline Phosphatase: 60 [IU]/L (ref 49–135)
BUN/Creatinine Ratio: 23 (ref 9–23)
BUN: 13 mg/dL (ref 6–24)
Bilirubin Total: 0.6 mg/dL (ref 0.0–1.2)
CO2: 23 mmol/L (ref 20–29)
Calcium: 9.9 mg/dL (ref 8.7–10.2)
Chloride: 104 mmol/L (ref 96–106)
Creatinine, Ser: 0.56 mg/dL — ABNORMAL LOW (ref 0.57–1.00)
Globulin, Total: 2.3 g/dL (ref 1.5–4.5)
Glucose: 93 mg/dL (ref 70–99)
Potassium: 4.4 mmol/L (ref 3.5–5.2)
Sodium: 140 mmol/L (ref 134–144)
Total Protein: 6.8 g/dL (ref 6.0–8.5)
eGFR: 106 mL/min/{1.73_m2}

## 2024-12-27 LAB — LIPID PANEL
Chol/HDL Ratio: 3.3 ratio (ref 0.0–4.4)
Cholesterol, Total: 193 mg/dL (ref 100–199)
HDL: 59 mg/dL
LDL Chol Calc (NIH): 122 mg/dL — ABNORMAL HIGH (ref 0–99)
Triglycerides: 65 mg/dL (ref 0–149)
VLDL Cholesterol Cal: 12 mg/dL (ref 5–40)

## 2024-12-27 LAB — HEMOGLOBIN A1C
Est. average glucose Bld gHb Est-mCnc: 97 mg/dL
Hgb A1c MFr Bld: 5 % (ref 4.8–5.6)
